# Patient Record
Sex: Female | Born: 1950
Health system: Southern US, Community
[De-identification: ages and names within clinical notes are randomized; demographics above are authoritative.]

## PROBLEM LIST (undated history)

## (undated) DIAGNOSIS — I7300 Raynaud's syndrome without gangrene: Secondary | ICD-10-CM

## (undated) DIAGNOSIS — E079 Disorder of thyroid, unspecified: Secondary | ICD-10-CM

## (undated) DIAGNOSIS — I10 Essential (primary) hypertension: Secondary | ICD-10-CM

## (undated) DIAGNOSIS — R748 Abnormal levels of other serum enzymes: Secondary | ICD-10-CM

## (undated) DIAGNOSIS — E785 Hyperlipidemia, unspecified: Secondary | ICD-10-CM

## (undated) DIAGNOSIS — H269 Unspecified cataract: Secondary | ICD-10-CM

## (undated) DIAGNOSIS — T7840XA Allergy, unspecified, initial encounter: Secondary | ICD-10-CM

## (undated) HISTORY — DX: Raynaud's syndrome without gangrene: I73.00

## (undated) HISTORY — DX: Abnormal levels of other serum enzymes: R74.8

## (undated) HISTORY — DX: Essential (primary) hypertension: I10

## (undated) HISTORY — DX: Disorder of thyroid, unspecified: E07.9

## (undated) HISTORY — DX: Hyperlipidemia, unspecified: E78.5

## (undated) HISTORY — DX: Unspecified cataract: H26.9

## (undated) HISTORY — DX: Allergy, unspecified, initial encounter: T78.40XA

## (undated) HISTORY — PX: EYE SURGERY: SHX253

## (undated) HISTORY — PX: OTHER SURGICAL HISTORY: SHX169

---

## 2001-07-25 ENCOUNTER — Other Ambulatory Visit: Admission: RE | Admit: 2001-07-25 | Discharge: 2001-07-25 | Payer: Self-pay | Admitting: Family Medicine

## 2001-08-03 ENCOUNTER — Encounter: Payer: Self-pay | Admitting: Family Medicine

## 2001-08-03 ENCOUNTER — Encounter: Admission: RE | Admit: 2001-08-03 | Discharge: 2001-08-03 | Payer: Self-pay | Admitting: Family Medicine

## 2004-08-26 ENCOUNTER — Encounter: Admission: RE | Admit: 2004-08-26 | Discharge: 2004-08-26 | Payer: Self-pay | Admitting: Family Medicine

## 2006-06-14 ENCOUNTER — Encounter: Admission: RE | Admit: 2006-06-14 | Discharge: 2006-06-14 | Payer: Self-pay | Admitting: Family Medicine

## 2008-01-09 DIAGNOSIS — I7300 Raynaud's syndrome without gangrene: Secondary | ICD-10-CM | POA: Insufficient documentation

## 2008-01-11 ENCOUNTER — Encounter: Admission: RE | Admit: 2008-01-11 | Discharge: 2008-01-11 | Payer: Self-pay | Admitting: Family Medicine

## 2008-04-22 ENCOUNTER — Ambulatory Visit: Payer: Self-pay | Admitting: General Surgery

## 2008-04-22 LAB — HM COLONOSCOPY: HM Colonoscopy: NORMAL

## 2009-10-19 ENCOUNTER — Encounter: Admission: RE | Admit: 2009-10-19 | Discharge: 2009-10-19 | Payer: Self-pay | Admitting: Family Medicine

## 2010-12-20 ENCOUNTER — Other Ambulatory Visit: Payer: Self-pay | Admitting: Family Medicine

## 2010-12-20 DIAGNOSIS — Z1231 Encounter for screening mammogram for malignant neoplasm of breast: Secondary | ICD-10-CM

## 2010-12-28 ENCOUNTER — Ambulatory Visit: Payer: Self-pay

## 2011-01-04 ENCOUNTER — Ambulatory Visit
Admission: RE | Admit: 2011-01-04 | Discharge: 2011-01-04 | Disposition: A | Discharge status: Home / Self Care | Payer: BC Managed Care – PPO | Source: Ambulatory Visit | Attending: Family Medicine | Admitting: Family Medicine

## 2011-01-04 DIAGNOSIS — Z1231 Encounter for screening mammogram for malignant neoplasm of breast: Secondary | ICD-10-CM

## 2011-12-12 ENCOUNTER — Other Ambulatory Visit: Payer: Self-pay | Admitting: Family Medicine

## 2011-12-12 DIAGNOSIS — Z1231 Encounter for screening mammogram for malignant neoplasm of breast: Secondary | ICD-10-CM

## 2012-01-12 ENCOUNTER — Ambulatory Visit
Admission: RE | Admit: 2012-01-12 | Discharge: 2012-01-12 | Disposition: A | Discharge status: Home / Self Care | Payer: BC Managed Care – PPO | Source: Ambulatory Visit | Attending: Family Medicine | Admitting: Family Medicine

## 2012-01-12 DIAGNOSIS — Z1231 Encounter for screening mammogram for malignant neoplasm of breast: Secondary | ICD-10-CM

## 2012-06-07 ENCOUNTER — Ambulatory Visit: Payer: Self-pay | Admitting: Rheumatology

## 2012-12-27 ENCOUNTER — Other Ambulatory Visit: Payer: Self-pay

## 2012-12-27 DIAGNOSIS — Z1231 Encounter for screening mammogram for malignant neoplasm of breast: Secondary | ICD-10-CM

## 2012-12-27 LAB — HM PAP SMEAR: HM PAP: NEGATIVE

## 2013-01-18 ENCOUNTER — Ambulatory Visit: Payer: BC Managed Care – PPO

## 2013-01-24 ENCOUNTER — Ambulatory Visit
Admission: RE | Admit: 2013-01-24 | Discharge: 2013-01-24 | Disposition: A | Discharge status: Home / Self Care | Payer: BC Managed Care – PPO | Source: Ambulatory Visit

## 2013-01-24 DIAGNOSIS — Z1231 Encounter for screening mammogram for malignant neoplasm of breast: Secondary | ICD-10-CM

## 2014-02-11 ENCOUNTER — Other Ambulatory Visit: Payer: Self-pay

## 2014-02-11 DIAGNOSIS — Z1231 Encounter for screening mammogram for malignant neoplasm of breast: Secondary | ICD-10-CM

## 2014-03-06 ENCOUNTER — Ambulatory Visit
Admission: RE | Admit: 2014-03-06 | Discharge: 2014-03-06 | Disposition: A | Discharge status: Home / Self Care | Payer: BC Managed Care – PPO | Source: Ambulatory Visit

## 2014-03-06 DIAGNOSIS — Z1231 Encounter for screening mammogram for malignant neoplasm of breast: Secondary | ICD-10-CM

## 2014-03-06 LAB — HM MAMMOGRAPHY

## 2014-04-16 ENCOUNTER — Ambulatory Visit: Payer: Self-pay | Admitting: Family Medicine

## 2014-08-12 ENCOUNTER — Ambulatory Visit: Payer: Self-pay | Admitting: Family Medicine

## 2015-02-19 LAB — BASIC METABOLIC PANEL
BUN: 14 mg/dL (ref 4–21)
CREATININE: 0.8 mg/dL (ref 0.5–1.1)
Glucose: 102 mg/dL
Potassium: 4.6 mmol/L (ref 3.4–5.3)
Sodium: 142 mmol/L (ref 137–147)

## 2015-02-19 LAB — HEPATIC FUNCTION PANEL
ALT: 46 U/L — AB (ref 7–35)
AST: 31 U/L (ref 13–35)

## 2015-02-19 LAB — LIPID PANEL
CHOLESTEROL: 269 mg/dL — AB (ref 0–200)
HDL: 110 mg/dL — AB (ref 35–70)
LDL Cholesterol: 141 mg/dL
TRIGLYCERIDES: 88 mg/dL (ref 40–160)

## 2015-02-20 ENCOUNTER — Telehealth: Payer: Self-pay

## 2015-02-20 DIAGNOSIS — R945 Abnormal results of liver function studies: Principal | ICD-10-CM

## 2015-02-20 DIAGNOSIS — R7989 Other specified abnormal findings of blood chemistry: Secondary | ICD-10-CM

## 2015-02-20 NOTE — Telephone Encounter (Signed)
Pt message from [employer]: Lipids the same.  Two liver enzymes are elevated.  Needs GI referral if has not been done previously. Thanks- Dr. Judie PetitM. Pt has no preference in who to see. Allene DillonEmily Drozdowski, CMA

## 2015-03-04 ENCOUNTER — Ambulatory Visit: Payer: Self-pay | Admitting: Gastroenterology

## 2015-03-16 ENCOUNTER — Ambulatory Visit: Payer: Self-pay | Admitting: Gastroenterology

## 2015-03-17 ENCOUNTER — Ambulatory Visit (INDEPENDENT_AMBULATORY_CARE_PROVIDER_SITE_OTHER): Payer: BC Managed Care – PPO | Admitting: Gastroenterology

## 2015-03-17 ENCOUNTER — Encounter: Payer: Self-pay | Admitting: Gastroenterology

## 2015-03-17 ENCOUNTER — Other Ambulatory Visit: Payer: Self-pay

## 2015-03-17 VITALS — BP 165/81 | HR 109 | Temp 98.6°F | Resp 20 | Ht 67.0 in | Wt 169.0 lb

## 2015-03-17 DIAGNOSIS — R748 Abnormal levels of other serum enzymes: Secondary | ICD-10-CM | POA: Diagnosis not present

## 2015-03-17 DIAGNOSIS — I7300 Raynaud's syndrome without gangrene: Secondary | ICD-10-CM | POA: Insufficient documentation

## 2015-03-17 HISTORY — DX: Raynaud's syndrome without gangrene: I73.00

## 2015-03-17 HISTORY — DX: Abnormal levels of other serum enzymes: R74.8

## 2015-03-17 NOTE — Progress Notes (Signed)
Gastroenterology Consultation  Referring Provider:     No ref. provider found Primary Care Physician:  Lorie Phenix, MD Primary Gastroenterologist:  Dr. Servando Snare     Reason for Consultation:     Increased liver enzymes        HPI:   Angela Valenzuela is a 65 y.o. y/o female referred for consultation & management of  Increased liver enzymes by Dr. Lorie Phenix, MD.   The patient reports that she has a history of scleroderma with joint pain for which is taking meloxicam. The patient reports that her abnormal liver enzymes were noticed before she started taking meloxicam. The patient denies any alcohol abuse or other hepatotoxic drugs. The patient also denies any high risk exposure to any viruses that can cause hepatitis. The patient denies starting on any other medications recently. There is no report of any abdominal pain associated with her abnormal liver enzymes. The patient also reports that she had an ultrasound last year that did not report any fatty liver.  Past Medical History  Diagnosis Date  . Paroxysmal digital cyanosis 03/17/2015    Overview:   a.  Positive anticentromere antibody b.   Sicca-xerostomia c.  RVSP 41 in 2013   . Hypertension   . Hyperlipidemia     History reviewed. No pertinent past surgical history.  Prior to Admission medications   Medication Sig Start Date End Date Taking? Authorizing Provider  amLODipine (NORVASC) 5 MG tablet  03/15/15  Yes Historical Provider, MD  Calcium-Vitamin D 600-200 MG-UNIT per tablet Take by mouth.   Yes Historical Provider, MD  meloxicam (MOBIC) 7.5 MG tablet Take 7.5 mg by mouth daily. 02/24/15  Yes Historical Provider, MD  RA KRILL OIL 500 MG CAPS Take by mouth.   Yes Historical Provider, MD  sodium fluoride (DENTA 5000 PLUS) 1.1 % CREA dental cream  11/14/14  Yes Historical Provider, MD  diltiazem (CARDIZEM LA) 120 MG 24 hr tablet Take by mouth. 02/05/14   Historical Provider, MD  omeprazole (PRILOSEC) 20 MG capsule Take by mouth.     Historical Provider, MD    Family History  Problem Relation Age of Onset  . Hypertension Father   . Stroke Father   . Alzheimer's disease Mother      History  Substance Use Topics  . Smoking status: Never Smoker   . Smokeless tobacco: Never Used  . Alcohol Use: 0.0 oz/week    0 Standard drinks or equivalent per week     Comment: occassional    Allergies as of 03/17/2015  . (No Known Allergies)    Review of Systems:    All systems reviewed and negative except where noted in HPI.   Physical Exam:  BP 165/81 mmHg  Pulse 109  Temp(Src) 98.6 F (37 C) (Oral)  Resp 20  Ht  (1.702 m)  Wt 169 lb (76.658 kg)  BMI 26.46 kg/m2 No LMP recorded. Patient is postmenopausal. Psych:  Alert and cooperative. Normal mood and affect. General:   Alert,  Well-developed, well-nourished, pleasant and cooperative in NAD Head:  Normocephalic and atraumatic. Eyes:  Sclera clear, no icterus.   Conjunctiva pink. Ears:  Normal auditory acuity. Nose:  No deformity, discharge, or lesions. Mouth:  No deformity or lesions,oropharynx pink & moist. Neck:  Supple; no masses or thyromegaly. Lungs:  Respirations even and unlabored.  Clear throughout to auscultation.   No wheezes, crackles, or rhonchi. No acute distress. Heart:  Regular rate and rhythm; no murmurs, clicks, rubs,  or gallops. Abdomen:  Normal bowel sounds.  No bruits.  Soft, non-tender and non-distended without masses. Hepatomegaly was noted with a normal spleen and no hernias noted.  No guarding or rebound tenderness.  Negative Carnett sign.   Rectal:  Deferred.  Msk:  Symmetrical without gross deformities.  Good, equal movement & strength bilaterally. Pulses:  Normal pulses noted. Extremities:  No clubbing or edema.  No cyanosis. Neurologic:  Alert and oriented x3;  grossly normal neurologically. Skin:  Intact without significant lesions or rashes.  No jaundice. Lymph Nodes:  No significant cervical adenopathy. Psych:  Alert and  cooperative. Normal mood and affect.  Imaging Studies: No results found.  Assessment and Plan:   Angela Valenzuela is a 64 y.o. y/o female  who comes in today with a report of abnormal liver enzymes. The patient had an ultrasound last year that did not show any sign of a cause for her abnormal liver enzymes. Due to her history of autoimmune disease the patient may be suffering from autoimmune hepatitis or abnormal liver enzymes associated with her scleroderma. The patient will have blood work sent off today to look for other possible causes of her abnormal liver enzymes. She is also been told that if they do not show a cause for her abnormal liver enzymes she may need a liver biopsy to evaluate her for the cause of the abnormal liver enzymes. The patient has been explained the plan and agrees it. with it.

## 2015-03-18 LAB — HEPATIC FUNCTION PANEL
ALT: 44 IU/L — AB (ref 0–32)
AST: 31 IU/L (ref 0–40)
Albumin: 4.5 g/dL (ref 3.6–4.8)
Alkaline Phosphatase: 183 IU/L — ABNORMAL HIGH (ref 39–117)
Bilirubin Total: 0.2 mg/dL (ref 0.0–1.2)
Bilirubin, Direct: 0.09 mg/dL (ref 0.00–0.40)
TOTAL PROTEIN: 6.7 g/dL (ref 6.0–8.5)

## 2015-03-18 LAB — CERULOPLASMIN: CERULOPLASMIN: 37.9 mg/dL (ref 19.0–39.0)

## 2015-03-18 LAB — HEPATITIS C ANTIBODY

## 2015-03-18 LAB — EXTRACTABLE NUCLEAR ANTIGEN ANTIBODY
ENA SSB (LA) Ab: <0.2 AI (ref 0.0–0.9)
Scleroderma SCL-70: <0.2 AI (ref 0.0–0.9)
dsDNA Ab: <1 IU/mL (ref 0–9)

## 2015-03-18 LAB — MITOCHONDRIAL ANTIBODIES: MITOCHONDRIAL AB: 25.7 U — AB (ref 0.0–20.0)

## 2015-03-18 LAB — HEPATITIS B SURFACE ANTIBODY,QUALITATIVE: Hep B Surface Ab, Qual: NONREACTIVE

## 2015-03-18 LAB — HEPATITIS A ANTIBODY, TOTAL: Hep A Total Ab: NEGATIVE

## 2015-03-18 LAB — ALPHA-1-ANTITRYPSIN: A-1 Antitrypsin: 142 mg/dL (ref 90–200)

## 2015-03-18 LAB — ANTI-SMOOTH MUSCLE ANTIBODY, IGG: Smooth Muscle Ab: 7 Units (ref 0–19)

## 2015-03-18 LAB — IGG, IGA, IGM
IGA/IMMUNOGLOBULIN A, SERUM: 74 mg/dL — AB (ref 87–352)
IGG (IMMUNOGLOBIN G), SERUM: 364 mg/dL — AB (ref 700–1600)
IgM (Immunoglobulin M), Srm: 159 mg/dL (ref 26–217)

## 2015-03-18 LAB — AFP TUMOR MARKER: AFP TUMOR MARKER: 2.4 ng/mL (ref 0.0–8.3)

## 2015-03-18 LAB — HEPATITIS B SURFACE ANTIGEN: Hepatitis B Surface Ag: NEGATIVE

## 2015-03-25 ENCOUNTER — Telehealth: Payer: Self-pay

## 2015-03-25 NOTE — Telephone Encounter (Signed)
-----   Message from Midge Miniumarren Wohl, MD sent at 03/24/2015 12:50 PM EDT ----- Dontarious Schaum, please have the patient come in to follow up with me about her lab results.

## 2015-03-25 NOTE — Telephone Encounter (Signed)
LVM for pt to schedule follow up appt to discuss lab results.

## 2015-03-26 NOTE — Telephone Encounter (Signed)
Pt notified of request for an appt. Pt scheduled with Dr. Servando SnareWohl on Monday, July 11th.

## 2015-03-26 NOTE — Telephone Encounter (Signed)
-----   Message from Darren Wohl, MD sent at 03/24/2015 12:50 PM EDT ----- Wildon Cuevas, please have the patient come in to follow up with me about her lab results. 

## 2015-03-30 ENCOUNTER — Encounter: Payer: Self-pay | Admitting: Gastroenterology

## 2015-03-30 ENCOUNTER — Ambulatory Visit (INDEPENDENT_AMBULATORY_CARE_PROVIDER_SITE_OTHER): Payer: BC Managed Care – PPO | Admitting: Gastroenterology

## 2015-03-30 VITALS — BP 153/71 | HR 93 | Temp 98.3°F | Ht 67.0 in | Wt 170.0 lb

## 2015-03-30 DIAGNOSIS — K743 Primary biliary cirrhosis: Secondary | ICD-10-CM

## 2015-03-30 MED ORDER — URSODIOL 300 MG PO CAPS: 300.0000 mg | ORAL_CAPSULE | Freq: Four times a day (QID) | ORAL | Status: DC

## 2015-03-30 NOTE — Progress Notes (Signed)
   Primary Care Physician: Lorie PhenixNancy Maloney, MD  Primary Gastroenterologist:  Dr. Midge Miniumarren Jatavia Keltner  Chief Complaint  Patient presents with  . F/U lab results    HPI: Angela Valenzuela is a 64 y.o. female here for review of blood work. The patient had been found to have abnormal liver enzymes and had blood work sent off for possible cause of the abnormal liver enzymes. The patient's labs showed her to have an anti-mitochondrial antibody positive. The patient also has a history of scleroderma. The patient's alkaline phosphatase has been elevated.  Current Outpatient Prescriptions  Medication Sig Dispense Refill  . amLODipine (NORVASC) 5 MG tablet     . Calcium-Vitamin D 600-200 MG-UNIT per tablet Take by mouth.    . meloxicam (MOBIC) 7.5 MG tablet Take 7.5 mg by mouth daily.  3  . RA KRILL OIL 500 MG CAPS Take by mouth.    . sodium fluoride (DENTA 5000 PLUS) 1.1 % CREA dental cream     . omeprazole (PRILOSEC) 20 MG capsule Take by mouth.    . ursodiol (ACTIGALL) 300 MG capsule Take 1 capsule (300 mg total) by mouth 4 (four) times daily. 120 capsule 4   No current facility-administered medications for this visit.    Allergies as of 03/30/2015  . (No Known Allergies)    ROS:  General: Negative for anorexia, weight loss, fever, chills, fatigue, weakness. ENT: Negative for hoarseness, difficulty swallowing , nasal congestion. CV: Negative for chest pain, angina, palpitations, dyspnea on exertion, peripheral edema.  Respiratory: Negative for dyspnea at rest, dyspnea on exertion, cough, sputum, wheezing.  GI: See history of present illness. GU:  Negative for dysuria, hematuria, urinary incontinence, urinary frequency, nocturnal urination.  Endo: Negative for unusual weight change.    Physical Examination:   BP 153/71 mmHg  Pulse 93  Temp(Src) 98.3 F (36.8 C)  Ht 5\' 7"  (1.702 m)  Wt 170 lb (77.111 kg)  BMI 26.62 kg/m2  General: Well-nourished, well-developed in no acute distress.    Eyes: No icterus. Conjunctivae pink. Mouth: Oropharyngeal mucosa moist and pink , no lesions erythema or exudate. Lungs: Clear to auscultation bilaterally. Non-labored. Heart: Regular rate and rhythm, no murmurs rubs or gallops.  Abdomen: Bowel sounds are normal, nontender, nondistended, no hepatosplenomegaly or masses, no abdominal bruits or hernia , no rebound or guarding.   Extremities: No lower extremity edema. No clubbing or deformities. Neuro: Alert and oriented x 3.  Grossly intact. Skin: Warm and dry, no jaundice.   Psych: Alert and cooperative, normal mood and affect.  Labs:    Imaging Studies: No results found.  Assessment and Plan:   Angela Valenzuela is a 64 y.o. y/o female who comes in with a positive anti-mitochondrial antibody and abnormal liver enzymes. The patient is not excited about undergoing a liver biopsy. The patient has been offered treatment for possible PBC with monitoring of her alkaline phosphatase. The patient will be started on 1200 mg in divided doses of ursodeoxycholic acid. The patient will follow-up in one month's time for recheck of her liver enzymes.

## 2015-04-30 ENCOUNTER — Ambulatory Visit (INDEPENDENT_AMBULATORY_CARE_PROVIDER_SITE_OTHER): Payer: BC Managed Care – PPO | Admitting: Gastroenterology

## 2015-04-30 VITALS — BP 149/67 | HR 100 | Temp 98.3°F | Ht 67.0 in | Wt 168.0 lb

## 2015-04-30 DIAGNOSIS — K743 Primary biliary cirrhosis: Secondary | ICD-10-CM | POA: Diagnosis not present

## 2015-04-30 DIAGNOSIS — I10 Essential (primary) hypertension: Secondary | ICD-10-CM | POA: Insufficient documentation

## 2015-04-30 DIAGNOSIS — I7300 Raynaud's syndrome without gangrene: Secondary | ICD-10-CM

## 2015-04-30 DIAGNOSIS — E785 Hyperlipidemia, unspecified: Secondary | ICD-10-CM | POA: Insufficient documentation

## 2015-04-30 HISTORY — DX: Raynaud's syndrome without gangrene: I73.00

## 2015-04-30 NOTE — Progress Notes (Signed)
   Primary Care Physician: Lorie Phenix, MD  Primary Gastroenterologist:  Dr. Midge Minium  Chief Complaint  Patient presents with  . F/U PBC    HPI: Angela Valenzuela is a 64 y.o. female here for follow-up after being treated for possible PBC. The patient had abnormal alkaline phosphatase and was found to have a high AMA. The patient was started on ursodeoxycholic acid and now comes in for follow-up. This has been treated for last month. The patient states she has had no side effects of medication and has been feeling well.  Current Outpatient Prescriptions  Medication Sig Dispense Refill  . amLODipine (NORVASC) 5 MG tablet     . Calcium-Vitamin D 600-200 MG-UNIT per tablet Take by mouth.    . meloxicam (MOBIC) 7.5 MG tablet Take 7.5 mg by mouth daily.  3  . RA KRILL OIL 500 MG CAPS Take by mouth.    . sodium fluoride (DENTA 5000 PLUS) 1.1 % CREA dental cream     . ursodiol (ACTIGALL) 300 MG capsule Take 1 capsule (300 mg total) by mouth 4 (four) times daily. 120 capsule 4  . omeprazole (PRILOSEC) 20 MG capsule Take by mouth.     No current facility-administered medications for this visit.    Allergies as of 04/30/2015  . (No Known Allergies)    ROS:  General: Negative for anorexia, weight loss, fever, chills, fatigue, weakness. ENT: Negative for hoarseness, difficulty swallowing , nasal congestion. CV: Negative for chest pain, angina, palpitations, dyspnea on exertion, peripheral edema.  Respiratory: Negative for dyspnea at rest, dyspnea on exertion, cough, sputum, wheezing.  GI: See history of present illness. GU:  Negative for dysuria, hematuria, urinary incontinence, urinary frequency, nocturnal urination.  Endo: Negative for unusual weight change.    Physical Examination:   BP 149/67 mmHg  Pulse 100  Temp(Src) 98.3 F (36.8 C)  Ht  (1.702 m)  Wt 168 lb (76.204 kg)  BMI 26.31 kg/m2  General: Well-nourished, well-developed in no acute distress.  Eyes: No  icterus. Conjunctivae pink. Mouth: Oropharyngeal mucosa moist and pink , no lesions erythema or exudate. Lungs: Clear to auscultation bilaterally. Non-labored. Heart: Regular rate and rhythm, no murmurs rubs or gallops.  Abdomen: Bowel sounds are normal, nontender, nondistended, no hepatosplenomegaly or masses, no abdominal bruits or hernia , no rebound or guarding.   Extremities: No lower extremity edema. No clubbing or deformities. Neuro: Alert and oriented x 3.  Grossly intact. Skin: Warm and dry, no jaundice.   Psych: Alert and cooperative, normal mood and affect.  Labs:    Imaging Studies: No results found.  Assessment and Plan:   Angela Valenzuela is a 64 y.o. y/o female with a positive AMA and recent treatment with ursodeoxycholic acid for the last month. The patient will continue the ursodeoxycholic acid and she will have her labs sent today for liver enzymes. If the liver enzymes are better then she will continue the ursodeoxycholic acid if they are not she may consider a liver biopsy.   Note: This dictation was prepared with Dragon dictation along with smaller phrase technology. Any transcriptional errors that result from this process are unintentional.

## 2015-05-01 LAB — HEPATITIS B SURFACE ANTIBODY,QUALITATIVE: Hep B Surface Ab, Qual: NONREACTIVE

## 2015-05-06 LAB — SPECIMEN STATUS REPORT

## 2015-05-06 LAB — HEPATIC FUNCTION PANEL

## 2015-05-13 ENCOUNTER — Other Ambulatory Visit: Payer: Self-pay

## 2015-05-13 DIAGNOSIS — Z1231 Encounter for screening mammogram for malignant neoplasm of breast: Secondary | ICD-10-CM

## 2015-05-13 LAB — HEPATIC FUNCTION PANEL
ALT: 22 IU/L (ref 0–32)
AST: 15 IU/L (ref 0–40)
Albumin: 4.4 g/dL (ref 3.6–4.8)
Alkaline Phosphatase: 115 IU/L (ref 39–117)
BILIRUBIN TOTAL: 0.2 mg/dL (ref 0.0–1.2)
BILIRUBIN, DIRECT: 0.07 mg/dL (ref 0.00–0.40)
Total Protein: 6.2 g/dL (ref 6.0–8.5)

## 2015-05-18 ENCOUNTER — Telehealth: Payer: Self-pay

## 2015-05-18 NOTE — Telephone Encounter (Signed)
-----   Message from Midge Minium, MD sent at 05/18/2015  6:43 AM EDT ----- Let the patient know that her liver tests are back to normal and that she should continue the medication.

## 2015-05-18 NOTE — Telephone Encounter (Signed)
Pt notified of results. Will repeat labs in 3 months.

## 2015-06-22 ENCOUNTER — Ambulatory Visit: Payer: BC Managed Care – PPO

## 2015-06-23 ENCOUNTER — Ambulatory Visit: Payer: BC Managed Care – PPO

## 2015-06-24 DIAGNOSIS — R945 Abnormal results of liver function studies: Secondary | ICD-10-CM | POA: Insufficient documentation

## 2015-06-24 DIAGNOSIS — E039 Hypothyroidism, unspecified: Secondary | ICD-10-CM | POA: Insufficient documentation

## 2015-06-24 DIAGNOSIS — N281 Cyst of kidney, acquired: Secondary | ICD-10-CM | POA: Insufficient documentation

## 2015-06-24 DIAGNOSIS — R7989 Other specified abnormal findings of blood chemistry: Secondary | ICD-10-CM | POA: Insufficient documentation

## 2015-06-24 DIAGNOSIS — IMO0002 Reserved for concepts with insufficient information to code with codable children: Secondary | ICD-10-CM | POA: Insufficient documentation

## 2015-06-24 DIAGNOSIS — R682 Dry mouth, unspecified: Secondary | ICD-10-CM | POA: Insufficient documentation

## 2015-06-24 DIAGNOSIS — M349 Systemic sclerosis, unspecified: Secondary | ICD-10-CM | POA: Insufficient documentation

## 2015-06-24 DIAGNOSIS — L309 Dermatitis, unspecified: Secondary | ICD-10-CM | POA: Insufficient documentation

## 2015-06-24 DIAGNOSIS — E038 Other specified hypothyroidism: Secondary | ICD-10-CM | POA: Insufficient documentation

## 2015-06-24 DIAGNOSIS — R319 Hematuria, unspecified: Secondary | ICD-10-CM | POA: Insufficient documentation

## 2015-06-25 ENCOUNTER — Ambulatory Visit (INDEPENDENT_AMBULATORY_CARE_PROVIDER_SITE_OTHER): Payer: BC Managed Care – PPO | Admitting: Family Medicine

## 2015-06-25 ENCOUNTER — Encounter: Payer: Self-pay | Admitting: Family Medicine

## 2015-06-25 VITALS — BP 130/70 | HR 72 | Temp 98.3°F | Resp 16 | Ht 67.0 in | Wt 171.4 lb

## 2015-06-25 DIAGNOSIS — Z0000 Encounter for general adult medical examination without abnormal findings: Secondary | ICD-10-CM

## 2015-06-25 DIAGNOSIS — Z23 Encounter for immunization: Secondary | ICD-10-CM

## 2015-06-25 DIAGNOSIS — E785 Hyperlipidemia, unspecified: Secondary | ICD-10-CM

## 2015-06-25 DIAGNOSIS — I10 Essential (primary) hypertension: Secondary | ICD-10-CM | POA: Diagnosis not present

## 2015-06-25 LAB — POCT URINALYSIS DIPSTICK
Bilirubin, UA: NEGATIVE
GLUCOSE UA: NEGATIVE
Ketones, UA: NEGATIVE
Leukocytes, UA: NEGATIVE
Nitrite, UA: NEGATIVE
Protein, UA: NEGATIVE
RBC UA: NEGATIVE
SPEC GRAV UA: 1.01
UROBILINOGEN UA: 0.2
pH, UA: 7

## 2015-06-25 NOTE — Progress Notes (Addendum)
Patient: Angela Valenzuela, Female    DOB: [birth year], 64 y.o.   MRN: [id number] Visit Date: 06/25/2015  Today's Provider: Lorie Phenix, MD   Chief Complaint  Patient presents with  . Annual Exam   Subjective:    Annual physical exam Angela Valenzuela is a 64 y.o. female who presents today for health maintenance and complete physical. She feels well. She reports not exercising, has not been exercising lately since April 27 of this year. She reports she is sleeping well.  Last PCP:03/20/2014 Pap Smear: 12/27/2012 WNL; HPV Negative Mammogram: 03/06/2014 BI-RADS 1. Has Mammogram scheduled. Colonoscopy: 04/22/2008 Dr. Fonnie Mu. WNL. Repeat in 10 years. EKG: 03/20/2014 Tdap: 01/11/2008    Review of Systems  Constitutional: Negative.   HENT: Negative.   Eyes: Negative.   Respiratory: Negative.   Cardiovascular: Negative.   Gastrointestinal: Negative.   Endocrine: Positive for cold intolerance.  Genitourinary: Negative.   Musculoskeletal: Positive for neck stiffness (only in the morning).  Skin: Negative.   Allergic/Immunologic: Negative.   Neurological: Negative.   Hematological: Negative.   Psychiatric/Behavioral: Negative.     Social History She  reports that she has never smoked. She has never used smokeless tobacco. She reports that she drinks alcohol. She reports that she does not use illicit drugs. Social History   Social History  . Marital Status: Married    Spouse Name: N/A  . Number of Children: N/A  . Years of Education: N/A   Social History Main Topics  . Smoking status: Never Smoker   . Smokeless tobacco: Never Used  . Alcohol Use: 0.0 oz/week    0 Standard drinks or equivalent per week     Comment: occassional  . Drug Use: No  . Sexual Activity: Not Asked   Other Topics Concern  . None   Social History Narrative    Patient Active Problem List   Diagnosis Date Noted  . Kidney cysts 06/24/2015  . Dermatitis, eczematoid 06/24/2015  . Dry  mouth 06/24/2015  . Abnormal LFTs 06/24/2015  . Blood in the urine 06/24/2015  . 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 Subclinical hypothyroidism 06/24/2015  . Epidermoid carcinoma (HCC) 06/24/2015  . Raynaud's disease 04/30/2015  . Hypertension 04/30/2015  . Hyperlipidemia 04/30/2015  . Paroxysmal digital cyanosis 03/17/2015  . Increased liver enzymes 03/17/2015  . Raynaud's syndrome without gangrene 01/09/2008  . Hypercholesteremia 09/08/2006  . Allergic rhinitis 01/21/1999  . BP (high blood pressure) 01/21/1999    History reviewed. No pertinent past surgical history.  Family History  Family Status  Relation Status Death Age  . Father Deceased 28    HTN  . Mother Deceased 47    Alzheimer's disease  . Sister Alive   . Brother Alive    Her family history includes Alzheimer's disease in her mother; Hypertension in her father; Stroke in her father.    No Known Allergies  Previous Medications   AMLODIPINE (NORVASC) 5 MG TABLET       CALCIUM-VITAMIN D 600-200 MG-UNIT PER TABLET    Take by mouth.   IBUPROFEN (ADVIL) 200 MG TABLET    ADVIL, 200MG  (Oral Tablet)  1 Every Day, As Needed for 0 days  Quantity: 0.00;  Refills: 0   Ordered :11-Nov-2011  Amie Critchley ;  Started 01-Jan-2008 Active Comments: Medication taken as needed.    LORATADINE (CLARITIN) 10 MG TABLET    Take 1 tablet by mouth daily.   MELOXICAM (MOBIC) 7.5 MG TABLET  Take 7.5 mg by mouth daily.   OMEPRAZOLE (PRILOSEC) 20 MG CAPSULE    Take by mouth.   RA KRILL OIL 500 MG CAPS    Take by mouth.   SODIUM FLUORIDE (DENTA 5000 PLUS) 1.1 % CREA DENTAL CREAM       URSODIOL (ACTIGALL) 300 MG CAPSULE    Take 1 capsule (300 mg total) by mouth 4 (four) times daily.    Patient Care Team: Lorie Phenix, MD as PCP - General (Family Medicine)     Objective:   Vitals: BP 130/70 mmHg  Pulse 72  Temp(Src) 98.3 F (36.8 C) (Oral)  Resp 16  Ht  (1.702 m)  Wt 171 lb 6.4 oz (77.747 kg)  BMI 26.84  kg/m2   Physical Exam  Constitutional: She is oriented to person, place, and time. She appears well-developed and well-nourished.  HENT:  Head: Normocephalic and atraumatic.  Right Ear: External ear normal.  Left Ear: External ear normal.  Nose: Nose normal.  Mouth/Throat: Oropharynx is clear and moist.  Eyes: Conjunctivae and EOM are normal. Pupils are equal, round, and reactive to light.  Neck: Normal range of motion. Neck supple.  Cardiovascular: Normal rate, regular rhythm, normal heart sounds and intact distal pulses.   Pulmonary/Chest: Effort normal and breath sounds normal.  Abdominal: Soft. Bowel sounds are normal.  Musculoskeletal: Normal range of motion.  Neurological: She is alert and oriented to person, place, and time. She has normal reflexes.  Skin: Skin is warm and dry.  Psychiatric: She has a normal mood and affect. Her behavior is normal. Judgment and thought content normal.     Depression Screen PHQ 2/9 Scores 06/25/2015  PHQ - 2 Score 0      Assessment & Plan:     Routine Health Maintenance and Physical Exam  Exercise Activities and Dietary recommendations Goals    None      Immunization History  Administered Date(s) Administered  . Influenza,inj,Quad PF,36+ Mos 06/25/2015  . Tdap 01/11/2008    Health Maintenance  Topic Date Due  . HIV Screening  12/14/1965  . ZOSTAVAX  12/15/2010  . INFLUENZA VACCINE  04/20/2015  . PAP SMEAR  12/28/2015  . MAMMOGRAM  03/06/2016  . TETANUS/TDAP  01/10/2018  . COLONOSCOPY  04/22/2018  . Hepatitis C Screening  Completed       1. Annual physical exam Stable. Patient advised to continue eating healthy and exercise daily.  Gave exercises to strengthen quads after meniscus tear. Ok to increase walking.   - POCT urinalysis dipstick Results for orders placed or performed in visit on 06/25/15  POCT urinalysis dipstick  Result Value Ref Range   Color, UA Yellow    Clarity, UA Clear    Glucose, UA negative      Bilirubin, UA negative    Ketones, UA negative    Spec Grav, UA 1.010    Blood, UA negative    pH, UA 7.0    Protein, UA negative    Urobilinogen, UA 0.2    Nitrite, UA negative    Leukocytes, UA Negative Negative    2. Need for influenza vaccination - Flu Vaccine QUAD 36+ mos IM  3. Essential hypertension Patient advised to have labs check every six months - Comprehensive metabolic panel - CBC with Differential/Platelet  4. Hyperlipidemia As above - Lipid panel - TSH  Patient was seen and examined by Leo Grosser, MD, and note scribed by Hetty Ely, CMA. I have reviewed the document for  accuracy and completeness and I agree with above. Leo Grosser, MD   Lorie Phenix, MD     --------------------------------------------------------------------

## 2015-07-04 LAB — CBC WITH DIFFERENTIAL/PLATELET
BASOS ABS: 0 10*3/uL (ref 0.0–0.2)
Basos: 0 %
EOS (ABSOLUTE): 0.1 10*3/uL (ref 0.0–0.4)
Eos: 2 %
HEMATOCRIT: 41.6 % (ref 34.0–46.6)
HEMOGLOBIN: 13.8 g/dL (ref 11.1–15.9)
IMMATURE GRANULOCYTES: 0 %
Immature Grans (Abs): 0 10*3/uL (ref 0.0–0.1)
LYMPHS ABS: 1.4 10*3/uL (ref 0.7–3.1)
LYMPHS: 30 %
MCH: 28.2 pg (ref 26.6–33.0)
MCHC: 33.2 g/dL (ref 31.5–35.7)
MCV: 85 fL (ref 79–97)
MONOCYTES: 7 %
Monocytes Absolute: 0.3 10*3/uL (ref 0.1–0.9)
NEUTROS PCT: 61 %
Neutrophils Absolute: 2.8 10*3/uL (ref 1.4–7.0)
Platelets: 218 10*3/uL (ref 150–379)
RBC: 4.89 x10E6/uL (ref 3.77–5.28)
RDW: 13.9 % (ref 12.3–15.4)
WBC: 4.6 10*3/uL (ref 3.4–10.8)

## 2015-07-04 LAB — COMPREHENSIVE METABOLIC PANEL
A/G RATIO: 2.1 (ref 1.1–2.5)
ALBUMIN: 4.1 g/dL (ref 3.6–4.8)
ALK PHOS: 114 IU/L (ref 39–117)
ALT: 18 IU/L (ref 0–32)
AST: 18 IU/L (ref 0–40)
BILIRUBIN TOTAL: 0.3 mg/dL (ref 0.0–1.2)
BUN / CREAT RATIO: 23 (ref 11–26)
BUN: 15 mg/dL (ref 8–27)
CALCIUM: 9.3 mg/dL (ref 8.7–10.3)
CHLORIDE: 103 mmol/L (ref 97–108)
CO2: 26 mmol/L (ref 18–29)
Creatinine, Ser: 0.64 mg/dL (ref 0.57–1.00)
GFR calc non Af Amer: 95 mL/min/{1.73_m2} (ref >59)
GFR, EST AFRICAN AMERICAN: 109 mL/min/{1.73_m2} (ref >59)
Globulin, Total: 2 g/dL (ref 1.5–4.5)
Glucose: 98 mg/dL (ref 65–99)
POTASSIUM: 5 mmol/L (ref 3.5–5.2)
Sodium: 145 mmol/L — ABNORMAL HIGH (ref 134–144)
Total Protein: 6.1 g/dL (ref 6.0–8.5)

## 2015-07-04 LAB — LIPID PANEL
CHOL/HDL RATIO: 2.6 ratio (ref 0.0–4.4)
Cholesterol, Total: 247 mg/dL — ABNORMAL HIGH (ref 100–199)
HDL: 94 mg/dL (ref >39)
LDL Calculated: 139 mg/dL — ABNORMAL HIGH (ref 0–99)
Triglycerides: 72 mg/dL (ref 0–149)
VLDL Cholesterol Cal: 14 mg/dL (ref 5–40)

## 2015-07-04 LAB — TSH: TSH: 4.03 u[IU]/mL (ref 0.450–4.500)

## 2015-07-06 ENCOUNTER — Telehealth: Payer: Self-pay

## 2015-07-06 NOTE — Telephone Encounter (Signed)
Pt advised as directed below.   Thanks,   -Laura  

## 2015-07-06 NOTE — Telephone Encounter (Signed)
-----   Message from Lorie PhenixNancy Maloney, MD sent at 07/05/2015 10:41 AM EDT ----- Labs stable. Does have high cholesterol but good cholesterol is also high and 10 year risk of heart disease is calculated at 6 percent, which is below threshold for medical treatment. Eat healthy and exercise and recheck annually. Thanks.

## 2015-07-08 ENCOUNTER — Ambulatory Visit: Payer: BC Managed Care – PPO

## 2015-08-04 ENCOUNTER — Ambulatory Visit
Admission: RE | Admit: 2015-08-04 | Discharge: 2015-08-04 | Disposition: A | Discharge status: Home / Self Care | Payer: BC Managed Care – PPO | Source: Ambulatory Visit

## 2015-08-04 DIAGNOSIS — Z1231 Encounter for screening mammogram for malignant neoplasm of breast: Secondary | ICD-10-CM

## 2015-08-11 ENCOUNTER — Telehealth: Payer: Self-pay

## 2015-08-11 NOTE — Telephone Encounter (Signed)
-----   Message from Rayann HemanGinger Theadore Blunck, CMA sent at 05/18/2015  3:24 PM EDT ----- Regarding: repeat labs Call pt to repeat Hepatic function.

## 2015-08-11 NOTE — Telephone Encounter (Signed)
Pt had repeat CMP with PCP on 06/29/15. Hepatic function normal. No need for repeat at this time.

## 2015-08-17 ENCOUNTER — Telehealth: Payer: Self-pay | Admitting: Gastroenterology

## 2015-08-17 NOTE — Telephone Encounter (Signed)
Please call patient about her repeat labs

## 2015-08-17 NOTE — Telephone Encounter (Signed)
Pt is currently taking Ursodiol 300mg  qid. She is having to repeat labs with us every 3 months. Pt just had these done by Dr. Elease HashimotoMaloney on 07/03/15 and they were normal. Pt was hoping to reduce the amount daily of this medication. Pt said you told her if her enzymes remained low then you would consider reducing. Please advise if she still needs the 4 daily or if we can reduce.

## 2015-08-19 NOTE — Telephone Encounter (Signed)
She can try going down to 3 pills a day but that is the minimum recommended dose for her condition.

## 2015-08-19 NOTE — Telephone Encounter (Signed)
Tried contacting pt. No vm to leave message. 

## 2015-08-19 NOTE — Telephone Encounter (Signed)
Pt notified she can take 3 pills daily. Pt will continue 4 daily for now.

## 2015-08-30 ENCOUNTER — Other Ambulatory Visit: Payer: Self-pay | Admitting: Gastroenterology

## 2016-04-04 ENCOUNTER — Other Ambulatory Visit: Payer: Self-pay

## 2016-04-04 DIAGNOSIS — K743 Primary biliary cirrhosis: Secondary | ICD-10-CM

## 2016-04-04 MED ORDER — URSODIOL 300 MG PO CAPS: ORAL_CAPSULE | ORAL | Status: DC

## 2016-07-01 ENCOUNTER — Encounter: Payer: Self-pay | Admitting: Family Medicine

## 2016-07-01 ENCOUNTER — Ambulatory Visit (INDEPENDENT_AMBULATORY_CARE_PROVIDER_SITE_OTHER): Payer: Medicare Other | Admitting: Family Medicine

## 2016-07-01 VITALS — BP 142/74 | HR 88 | Temp 98.3°F | Resp 16 | Ht 66.5 in | Wt 176.2 lb

## 2016-07-01 DIAGNOSIS — E7800 Pure hypercholesterolemia, unspecified: Secondary | ICD-10-CM | POA: Diagnosis not present

## 2016-07-01 DIAGNOSIS — M349 Systemic sclerosis, unspecified: Secondary | ICD-10-CM | POA: Diagnosis not present

## 2016-07-01 DIAGNOSIS — Z23 Encounter for immunization: Secondary | ICD-10-CM | POA: Diagnosis not present

## 2016-07-01 DIAGNOSIS — I10 Essential (primary) hypertension: Secondary | ICD-10-CM | POA: Diagnosis not present

## 2016-07-01 DIAGNOSIS — I7300 Raynaud's syndrome without gangrene: Secondary | ICD-10-CM | POA: Diagnosis not present

## 2016-07-01 DIAGNOSIS — Z0000 Encounter for general adult medical examination without abnormal findings: Secondary | ICD-10-CM

## 2016-07-01 DIAGNOSIS — K743 Primary biliary cirrhosis: Secondary | ICD-10-CM | POA: Diagnosis not present

## 2016-07-01 NOTE — Progress Notes (Signed)
Patient: Angela Valenzuela, Female    DOB: 1951/04/05, 65 y.o.   MRN: [id number] Visit Date: 07/01/2016  Today's Provider: Dortha Kern, PA   Chief Complaint  Patient presents with  . Medicare Wellness   Subjective:   Initial preventative physical exam Angela Valenzuela is a 65 y.o. female who presents today for her Initial Preventative Physical Exam. She feels well. She reports exercising 2 times per week. She reports she is sleeping average 6-7 hours per night.  Review of Systems  Constitutional: Negative.   HENT: Negative.   Eyes: Negative.   Respiratory: Negative.   Cardiovascular: Negative.   Gastrointestinal: Negative.   Endocrine: Negative.   Genitourinary: Negative.   Musculoskeletal: Negative.   Skin: Negative.   Allergic/Immunologic: Negative.   Neurological: Negative.   Hematological: Negative.   Psychiatric/Behavioral: Negative.     Social History   Social History  . Marital status: Married    Spouse name: N/A  . Number of children: N/A  . Years of education: N/A   Occupational History  . Not on file.   Social History Main Topics  . Smoking status: Never Smoker  . Smokeless tobacco: Never Used  . Alcohol use 0.0 oz/week     Comment: occassional  . Drug use: No  . Sexual activity: Not on file   Other Topics Concern  . Not on file   Social History Narrative  . No narrative on file    Past Medical History:  Diagnosis Date  . Allergy   . Hyperlipidemia   . Hypertension   . Increased liver enzymes 03/17/2015  . Paroxysmal digital cyanosis 03/17/2015   Overview:   a.  Positive anticentromere antibody b.   Sicca-xerostomia c.  RVSP 41 in 2013   . Raynaud's disease 04/30/2015  . Thyroid disease      Patient Active Problem List   Diagnosis Date Noted  . Kidney cysts 06/24/2015  . Dermatitis, eczematoid 06/24/2015  . Dry mouth 06/24/2015  . Abnormal LFTs 06/24/2015  . Blood in the urine 06/24/2015  . Buschke's scleredema (HCC)  06/24/2015  . Subclinical hypothyroidism 06/24/2015  . Epidermoid carcinoma 06/24/2015  . Raynaud's disease 04/30/2015  . Hypertension 04/30/2015  . Hyperlipidemia 04/30/2015  . Paroxysmal digital cyanosis 03/17/2015  . Increased liver enzymes 03/17/2015  . Raynaud's syndrome without gangrene 01/09/2008  . Hypercholesteremia 09/08/2006  . Allergic rhinitis 01/21/1999  . BP (high blood pressure) 01/21/1999    No past surgical history on file.  Her family history includes Alzheimer's disease in her mother; Hypertension in her father; Stroke in her father.    Previous Medications   AMLODIPINE (NORVASC) 5 MG TABLET       CALCIUM-VITAMIN D 600-200 MG-UNIT PER TABLET    Take by mouth.   IBUPROFEN (ADVIL) 200 MG TABLET    ADVIL, 200MG  (Oral Tablet)  1 Every Day, As Needed for 0 days  Quantity: 0.00;  Refills: 0   Ordered :11-Nov-2011  Amie Critchley ;  Started 01-Jan-2008 Active Comments: Medication taken as needed.    LORATADINE (CLARITIN) 10 MG TABLET    Take 1 tablet by mouth daily.   MELOXICAM (MOBIC) 7.5 MG TABLET    Take 7.5 mg by mouth daily.   OMEPRAZOLE (PRILOSEC) 20 MG CAPSULE    Take by mouth.   RA KRILL OIL 500 MG CAPS    Take by mouth.   SODIUM FLUORIDE (DENTA 5000 PLUS) 1.1 % CREA DENTAL CREAM  URSODIOL (ACTIGALL) 300 MG CAPSULE    TAKE 1 CAPSULE (300 MG TOTAL) BY MOUTH 4 (FOUR) TIMES DAILY.    Patient Care Team: Tamsen Roers, PA as PCP - General (Family Medicine)     Objective:   Vitals: BP (!) 142/74 (BP Location: Right Arm, Patient Position: Sitting, Cuff Size: Normal)   Pulse 88   Temp 98.3 F (36.8 C) (Oral)   Resp 16   Ht 5' 6.5" (1.689 m)   Wt 176 lb 3.2 oz (79.9 kg)   BMI 28.01 kg/m   Physical Exam  Constitutional: She is oriented to person, place, and time. She appears well-developed and well-nourished.  HENT:  Head: Normocephalic and atraumatic.  Right Ear: External ear normal.  Left Ear: External ear normal.  Nose: Nose  normal.  Mouth/Throat: Oropharynx is clear and moist.  Eyes: Conjunctivae and EOM are normal. Pupils are equal, round, and reactive to light. Right eye exhibits no discharge.  Neck: Normal range of motion. Neck supple. No tracheal deviation present. No thyromegaly present.  Cardiovascular: Normal rate, regular rhythm, normal heart sounds and intact distal pulses.   No murmur heard. Pulmonary/Chest: Effort normal and breath sounds normal. No respiratory distress. She has no wheezes. She has no rales. She exhibits no tenderness.  Abdominal: Soft. She exhibits no distension and no mass. There is no tenderness. There is no rebound and no guarding.  Genitourinary:  Genitourinary Comments: Requests postponing exam and PAP.  Musculoskeletal: Normal range of motion. She exhibits no edema or tenderness.  Stiffness and Heberden's nodules on both DIP joints of index fingers.  Lymphadenopathy:    She has no cervical adenopathy.  Neurological: She is alert and oriented to person, place, and time. She has normal reflexes. No cranial nerve deficit. She exhibits normal muscle tone. Coordination normal.  Skin: Skin is warm and dry. No rash noted. No erythema.  Psychiatric: She has a normal mood and affect. Her behavior is normal. Judgment and thought content normal.    Activities of Daily Living In your present state of health, do you have any difficulty performing the following activities: 07/01/2016  Hearing? N  Vision? N  Difficulty concentrating or making decisions? N  Walking or climbing stairs? N  Dressing or bathing? N  Doing errands, shopping? N  Some recent data might be hidden    Fall Risk Assessment Fall Risk  07/01/2016 06/25/2015  Falls in the past year? No No     Depression Screen PHQ 2/9 Scores 07/01/2016 06/25/2015  PHQ - 2 Score 0 0    Cognitive Testing - 6-CIT  Correct? Score   What year is it? yes 0 0 or 4  What month is it? yes 0 0 or 3  Memorize:    Floyde Parkins,  42,   [address]  [place],      What time is it? (within 1 hour) yes 0 0 or 3  Count backwards from 20 yes 0 0, 2, or 4  Name the months of the year yes 0 0, 2, or 4  Repeat name & address above yes 0 0, 2, 4, 6, 8, or 10       TOTAL SCORE  0/28   Interpretation:  Normal  Normal (0-7) Abnormal (8-28)   Audit-C Alcohol Use Screening  Question Answer Points  How often do you have alcoholic drink? 2-4 times monthly 2  On days you do drink alcohol, how many drinks do you typically consume? 1 or 2 0  How oftey will you drink 6 or more in a total? never 0  Total Score:  2   A score of 3 or more in women, and 4 or more in men indicates increased risk for alcohol abuse, EXCEPT if all of the points are from question 1.     Assessment & Plan:     Initial Preventative Physical Exam  Reviewed patient's Family Medical History Reviewed and updated list of patient's medical providers Assessment of cognitive impairment was done Assessed patient's functional ability Established a written schedule for health screening services Health Risk Assessent Completed and Reviewed  Exercise Activities and Dietary recommendations Goals    . Exercise 150 minutes per week (moderate activity)       Immunization History  Administered Date(s) Administered  . Influenza,inj,Quad PF,36+ Mos 06/25/2015  . Tdap 01/11/2008    Health Maintenance  Topic Date Due  . HIV Screening  12/14/1965  . ZOSTAVAX  12/15/2010  . DEXA SCAN  12/15/2015  . PNA vac Low Risk Adult (1 of 2 - PCV13) 12/15/2015  . PAP SMEAR  12/28/2015  . INFLUENZA VACCINE  04/19/2016  . MAMMOGRAM  08/03/2017  . TETANUS/TDAP  01/10/2018  . COLONOSCOPY  04/22/2018  . Hepatitis C Screening  Completed      Discussed health benefits of physical activity, and encouraged her to engage in regular exercise appropriate for her age and condition.      ------------------------------------------------------------------------------------------------------------ 1. Welcome to Medicare preventive visit Good general health. Assessments normal without areas of concern. Will get flu shot today. Wants to postpone PAP and pneumonia vaccine. EKG normal. Recheck annually. - EKG 12-Lead  2. Essential hypertension Good control on the Norvasc 5 mg qd. Recheck labs next week. - CBC with Differential/Platelet; Future - Comprehensive metabolic panel; Future - Lipid panel; Future - TSH; Future  3. Raynaud's syndrome without gangrene Tolerating Norvasc daily and tries to keep hands warm. Only gets blanching of finger (especially index fingers both hands). No open sores or scabs. Recheck labs nest week. - CBC with Differential/Platelet; Future - Comprehensive metabolic panel; Future - TSH; Future  4. Buschke's scleredema (HCC) Probable cause of Raynaud's phenomena. Followed by rheumatologist every 6 months (last evaluation by Dr. Beverley [facility]W. [facility] was Sept. 2017).  - CBC with Differential/Platelet; Future - Comprehensive metabolic panel; Future  5. Hypercholesteremia Trying to follow low fat diet and exercise routinely. Still taking Krill Oil daily. Recheck labs next week. - Comprehensive metabolic panel; Future - Lipid panel; Future - TSH; Future  6. 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 Servando SnareWohl. Will get labs in the next week. Asymptomatic. - CBC with Differential/Platelet; Future - Comprehensive metabolic panel; Future - Lipid panel; Future  7. Need for influenza vaccination - Flu vaccine HIGH DOSE PF    Dortha Kernennis Chrismon, PA  [facility]

## 2016-07-29 ENCOUNTER — Other Ambulatory Visit: Payer: Self-pay | Admitting: Emergency Medicine

## 2016-07-29 DIAGNOSIS — M349 Systemic sclerosis, unspecified: Secondary | ICD-10-CM

## 2016-07-29 DIAGNOSIS — E7800 Pure hypercholesterolemia, unspecified: Secondary | ICD-10-CM

## 2016-07-29 DIAGNOSIS — I7300 Raynaud's syndrome without gangrene: Secondary | ICD-10-CM

## 2016-07-29 DIAGNOSIS — K743 Primary biliary cirrhosis: Secondary | ICD-10-CM

## 2016-07-29 DIAGNOSIS — I10 Essential (primary) hypertension: Secondary | ICD-10-CM

## 2016-07-30 LAB — COMPREHENSIVE METABOLIC PANEL
ALBUMIN: 4.3 g/dL (ref 3.6–4.8)
ALK PHOS: 126 IU/L — AB (ref 39–117)
ALT: 15 IU/L (ref 0–32)
AST: 15 IU/L (ref 0–40)
Albumin/Globulin Ratio: 2 (ref 1.2–2.2)
BUN / CREAT RATIO: 16 (ref 12–28)
BUN: 12 mg/dL (ref 8–27)
Bilirubin Total: 0.4 mg/dL (ref 0.0–1.2)
CO2: 24 mmol/L (ref 18–29)
CREATININE: 0.75 mg/dL (ref 0.57–1.00)
Calcium: 9.6 mg/dL (ref 8.7–10.3)
Chloride: 102 mmol/L (ref 96–106)
GFR calc Af Amer: 97 mL/min/{1.73_m2} (ref >59)
GFR calc non Af Amer: 84 mL/min/{1.73_m2} (ref >59)
GLUCOSE: 105 mg/dL — AB (ref 65–99)
Globulin, Total: 2.2 g/dL (ref 1.5–4.5)
Potassium: 4.3 mmol/L (ref 3.5–5.2)
Sodium: 142 mmol/L (ref 134–144)
TOTAL PROTEIN: 6.5 g/dL (ref 6.0–8.5)

## 2016-07-30 LAB — TSH: TSH: 2.93 u[IU]/mL (ref 0.450–4.500)

## 2016-07-30 LAB — CBC WITH DIFFERENTIAL/PLATELET
BASOS ABS: 0 10*3/uL (ref 0.0–0.2)
Basos: 0 %
EOS (ABSOLUTE): 0.1 10*3/uL (ref 0.0–0.4)
Eos: 2 %
HEMOGLOBIN: 13.2 g/dL (ref 11.1–15.9)
Hematocrit: 40.9 % (ref 34.0–46.6)
Immature Grans (Abs): 0 10*3/uL (ref 0.0–0.1)
Immature Granulocytes: 0 %
LYMPHS ABS: 1.2 10*3/uL (ref 0.7–3.1)
Lymphs: 26 %
MCH: 27.1 pg (ref 26.6–33.0)
MCHC: 32.3 g/dL (ref 31.5–35.7)
MCV: 84 fL (ref 79–97)
MONOCYTES: 8 %
MONOS ABS: 0.4 10*3/uL (ref 0.1–0.9)
NEUTROS ABS: 2.9 10*3/uL (ref 1.4–7.0)
Neutrophils: 64 %
PLATELETS: 255 10*3/uL (ref 150–379)
RBC: 4.87 x10E6/uL (ref 3.77–5.28)
RDW: 14 % (ref 12.3–15.4)
WBC: 4.5 10*3/uL (ref 3.4–10.8)

## 2016-07-30 LAB — LIPID PANEL
CHOLESTEROL TOTAL: 234 mg/dL — AB (ref 100–199)
Chol/HDL Ratio: 2.7 ratio units (ref 0.0–4.4)
HDL: 87 mg/dL (ref >39)
LDL CALC: 130 mg/dL — AB (ref 0–99)
Triglycerides: 87 mg/dL (ref 0–149)
VLDL CHOLESTEROL CAL: 17 mg/dL (ref 5–40)

## 2016-08-01 ENCOUNTER — Telehealth: Payer: Self-pay

## 2016-08-01 MED ORDER — SIMVASTATIN 20 MG PO TABS: 20.0000 mg | ORAL_TABLET | Freq: Every day | ORAL | 3 refills | Status: DC

## 2016-08-01 NOTE — Telephone Encounter (Signed)
-----   Message from Jodell CiproDennis E Greenvillehrismon, [place] sent at 08/01/2016  8:02 AM EST ----- Blood tests normal except alkaline phosphatase elevation (probably due to scleroderma/Raynaud's) and total cholesterol/LDL high. Need to add Simvastatin 20 mg qd #30 & 3 RF. Recheck appointment in 3 months.

## 2016-08-01 NOTE — Telephone Encounter (Signed)
Patient has been advised, appt arranged from 11/01/16. [facility] [place] KW

## 2016-08-02 ENCOUNTER — Telehealth: Payer: Self-pay

## 2016-08-02 NOTE — Telephone Encounter (Signed)
Patient has a question about her ursodiol medication. It's an insurance question. She received a letter and she is needing some clarification. Please call the patient to answer her questions

## 2016-08-02 NOTE — Telephone Encounter (Signed)
Spoke with pt regarding her Ursodial medication. Pt advised me in January her medication will change to a Tier 3 which will cost her $57.00. Told her I will call her insurance in January to request a Tier exception. Pt has enough refills to last until then.

## 2016-10-18 ENCOUNTER — Telehealth: Payer: Self-pay | Admitting: Gastroenterology

## 2016-10-18 NOTE — Telephone Encounter (Signed)
Please call patient. She has questions concerning new medication that was prescribed by Dr Orland Jarredroxler. She would like to know if these medications will be ok to take concerning her medical history with liver problems.   [phone]

## 2016-10-18 NOTE — Telephone Encounter (Signed)
The patient know that she can take this medication as long as we monitor her liver enzymes when she is taking the medication and again in 1 month's time to see if there is any change.

## 2016-10-18 NOTE — Telephone Encounter (Signed)
See below. Dr. Orland Jarredroxler prescribed pt Meloxicam 15mg  daily.

## 2016-10-18 NOTE — Telephone Encounter (Signed)
LVM for pt to return my call and advised her of Dr. Annabell SabalWohl's recommendation.

## 2016-10-19 NOTE — Telephone Encounter (Signed)
Pt returned call and advised of Dr. Annabell SabalWohl's recommendation below. Pt also wanted me to be aware she was having a tooth extracted and was going to be taking amoxicillin 500mg  daily for 21 days prior to extraction. Pt will contact me in 1 month after she has been on meloxicam to recheck liver enzymes.

## 2016-10-19 NOTE — Telephone Encounter (Signed)
Patient left a voice message that she was returning your call °

## 2016-10-28 ENCOUNTER — Other Ambulatory Visit: Payer: Self-pay | Admitting: Gastroenterology

## 2016-10-28 ENCOUNTER — Other Ambulatory Visit: Payer: Self-pay | Admitting: Family Medicine

## 2016-10-28 DIAGNOSIS — Z1231 Encounter for screening mammogram for malignant neoplasm of breast: Secondary | ICD-10-CM

## 2016-10-31 ENCOUNTER — Telehealth: Payer: Self-pay | Admitting: Family Medicine

## 2016-10-31 NOTE — Telephone Encounter (Signed)
Pt is wanting to know if she should r/s her f/u appt for a few weeks due to the flu.  She is very afraid she will catch it coming into our office.  Would like advise.

## 2016-10-31 NOTE — Telephone Encounter (Signed)
OK to reschedule in 4-6 weeks.

## 2016-10-31 NOTE — Telephone Encounter (Signed)
Patient rescheduled for 12/22/2016.

## 2016-11-01 ENCOUNTER — Ambulatory Visit: Payer: Self-pay | Admitting: Family Medicine

## 2016-11-03 ENCOUNTER — Ambulatory Visit: Payer: Self-pay | Admitting: Family Medicine

## 2016-11-16 ENCOUNTER — Ambulatory Visit
Admission: RE | Admit: 2016-11-16 | Discharge: 2016-11-16 | Disposition: A | Discharge status: Home / Self Care | Payer: Medicare Other | Source: Ambulatory Visit | Attending: Family Medicine | Admitting: Family Medicine

## 2016-11-16 ENCOUNTER — Telehealth: Payer: Self-pay | Admitting: Gastroenterology

## 2016-11-16 DIAGNOSIS — Z1231 Encounter for screening mammogram for malignant neoplasm of breast: Secondary | ICD-10-CM

## 2016-11-16 NOTE — Telephone Encounter (Signed)
Patient left a voice message for you to call her please °

## 2016-11-18 ENCOUNTER — Other Ambulatory Visit: Payer: Self-pay

## 2016-11-18 ENCOUNTER — Telehealth: Payer: Self-pay

## 2016-11-18 DIAGNOSIS — R748 Abnormal levels of other serum enzymes: Secondary | ICD-10-CM

## 2016-11-18 NOTE — Telephone Encounter (Signed)
-----   Message from Tamsen Roersennis E Chrismon, [place] sent at 11/18/2016 12:58 PM EST ----- Normal mammogram.

## 2016-11-18 NOTE — Telephone Encounter (Signed)
Patient advised.

## 2016-11-18 NOTE — Telephone Encounter (Signed)
Pt has been off of the meloxicam for 1 month. Per Dr. Servando SnareWohl, he wanted to repeat the liver enzymes after month off of the medication. Pt advised the order is in the system.

## 2016-11-22 DIAGNOSIS — M349 Systemic sclerosis, unspecified: Secondary | ICD-10-CM | POA: Insufficient documentation

## 2016-11-22 DIAGNOSIS — K743 Primary biliary cirrhosis: Secondary | ICD-10-CM | POA: Insufficient documentation

## 2016-11-24 LAB — HEPATIC FUNCTION PANEL
ALT: 13 IU/L (ref 0–32)
AST: 12 IU/L (ref 0–40)
Albumin: 4.6 g/dL (ref 3.6–4.8)
Alkaline Phosphatase: 142 IU/L — ABNORMAL HIGH (ref 39–117)
Bilirubin Total: <0.2 mg/dL (ref 0.0–1.2)
Bilirubin, Direct: 0.06 mg/dL (ref 0.00–0.40)
Total Protein: 6.5 g/dL (ref 6.0–8.5)

## 2016-11-27 ENCOUNTER — Other Ambulatory Visit: Payer: Self-pay | Admitting: Family Medicine

## 2016-12-15 ENCOUNTER — Telehealth: Payer: Self-pay

## 2016-12-15 NOTE — Telephone Encounter (Signed)
-----   Message from Midge Miniumarren Wohl, MD sent at 12/14/2016  8:12 AM EDT ----- Have patient follow-up in the office

## 2016-12-15 NOTE — Telephone Encounter (Signed)
Spoke with pt and results given. Pt has also been scheduled for a follow up appt as it's been since August of 2016 since last office visit.

## 2016-12-22 ENCOUNTER — Encounter: Payer: Self-pay | Admitting: Family Medicine

## 2016-12-22 ENCOUNTER — Ambulatory Visit (INDEPENDENT_AMBULATORY_CARE_PROVIDER_SITE_OTHER): Payer: Medicare Other | Admitting: Family Medicine

## 2016-12-22 VITALS — BP 140/80 | HR 95 | Temp 97.8°F | Resp 16 | Wt 173.0 lb

## 2016-12-22 DIAGNOSIS — K743 Primary biliary cirrhosis: Secondary | ICD-10-CM

## 2016-12-22 DIAGNOSIS — M349 Systemic sclerosis, unspecified: Secondary | ICD-10-CM | POA: Diagnosis not present

## 2016-12-22 DIAGNOSIS — I7300 Raynaud's syndrome without gangrene: Secondary | ICD-10-CM

## 2016-12-22 DIAGNOSIS — E7800 Pure hypercholesterolemia, unspecified: Secondary | ICD-10-CM

## 2016-12-22 NOTE — Progress Notes (Signed)
Patient: Angela Valenzuela Female    DOB: 12-30-1950   66 y.o.   MRN: [id number] Visit Date: 12/22/2016  Today's Provider: Dortha Kern, PA   Chief Complaint  Patient presents with  . Hyperlipidemia   Subjective:    HPI  Lipid/Cholesterol, Follow-up:   Last seen for this 6 months ago.  Management changes since that visit include started simvastatin. . Last Lipid Panel:    Component Value Date/Time   CHOL 234 (H) 07/29/2016 0901   TRIG 87 07/29/2016 0901   HDL 87 07/29/2016 0901   CHOLHDL 2.7 07/29/2016 0901   LDLCALC 130 (H) 07/29/2016 0901    Risk factors for vascular disease include hypercholesterolemia  She reports good compliance with treatment. She is having side effects, possibly. She reports that she has always had muscle cramps in her legs but they seem to have gotten worse but she is unsure if this is from the medications because she is unsure if this started when she started the simvastatin.  Current exercise: none due to tendon issue in foot.   Wt Readings from Last 3 Encounters:  12/22/16 173 lb (78.5 kg)  07/01/16 176 lb 3.2 oz (79.9 kg)  06/25/15 171 lb 6.4 oz (77.7 kg)    ------------------------------------------------------------------- Labs 07/29/16 alkaline phosphate was elevated. Lipids were elevated and Simvastatin was started. Follow up 3 months.   Past Medical History:  Diagnosis Date  . Allergy   . Hyperlipidemia   . Hypertension   . Increased liver enzymes 03/17/2015  . Paroxysmal digital cyanosis 03/17/2015   Overview:   a.  Positive anticentromere antibody b.   Sicca-xerostomia c.  RVSP 41 in 2013   . Raynaud's disease 04/30/2015  . Thyroid disease    History reviewed. No pertinent surgical history. Family History  Problem Relation Age of Onset  . Hypertension Father   . Stroke Father   . Alzheimer's disease Mother    No Known Allergies  Current Outpatient Prescriptions:  .  amLODipine (NORVASC) 5 MG tablet, , Disp: ,  Rfl:  .  Calcium-Vitamin D 600-200 MG-UNIT per tablet, Take by mouth., Disp: , Rfl:  .  RA KRILL OIL 500 MG CAPS, Take by mouth., Disp: , Rfl:  .  simvastatin (ZOCOR) 20 MG tablet, TAKE 1 TABLET (20 MG TOTAL) BY MOUTH AT BEDTIME., Disp: 30 tablet, Rfl: 3 .  ursodiol (ACTIGALL) 300 MG capsule, TAKE 1 CAPSULE (300 MG TOTAL) BY MOUTH 4 (FOUR) TIMES DAILY., Disp: 120 capsule, Rfl: 6 .  ibuprofen (ADVIL) 200 MG tablet, ADVIL,  (Oral Tablet)  1 Every Day, As Needed for 0 days  Quantity: 0.00;  Refills: 0   Ordered :11-Nov-2011  Amie Critchley ;  Started 01-Jan-2008 Active Comments: Medication taken as needed. , Disp: , Rfl:  .  loratadine (CLARITIN) 10 MG tablet, Take 1 tablet by mouth daily., Disp: , Rfl:  .  meloxicam (MOBIC) 7.5 MG tablet, Take 7.5 mg by mouth daily., Disp: , Rfl: 3 .  omeprazole (PRILOSEC) 20 MG capsule, Take by mouth., Disp: , Rfl:  .  sodium fluoride (DENTA 5000 PLUS) 1.1 % CREA dental cream, , Disp: , Rfl:   Review of Systems  Constitutional: Negative.   HENT: Negative.   Eyes: Negative.   Respiratory: Negative.   Cardiovascular: Negative.   Gastrointestinal: Negative.   Endocrine: Negative.   Genitourinary: Negative.   Musculoskeletal: Negative.   Skin: Negative.   Allergic/Immunologic: Negative.   Neurological: Negative.   Hematological: Negative.  Psychiatric/Behavioral: Negative.     Social History  Substance Use Topics  . Smoking status: Never Smoker  . Smokeless tobacco: Never Used  . Alcohol use 0.0 oz/week     Comment: occassional   Objective:   BP 140/80 (BP Location: Left Arm, Patient Position: Sitting, Cuff Size: Normal)   Pulse 95   Temp 97.8 F (36.6 C) (Oral)   Resp 16   Wt 173 lb (78.5 kg)   SpO2 98%   BMI 27.50 kg/m  Vitals:   12/22/16 0959  BP: 140/80  Pulse: 95  Resp: 16  Temp: 97.8 F (36.6 C)  TempSrc: Oral  SpO2: 98%  Weight: 173 lb (78.5 kg)   Physical Exam  Constitutional: She is oriented to person, place,  and time. She appears well-developed and well-nourished.  HENT:  Head: Normocephalic.  Eyes: Conjunctivae are normal.  Neck: Neck supple.  Cardiovascular: Normal rate and regular rhythm.   Pulmonary/Chest: Effort normal and breath sounds normal.  Abdominal: Soft. Bowel sounds are normal.  Musculoskeletal: Normal range of motion.  No muscle soreness or swelling. Good pulses. Cold fingers from Raynaud's and history of Scleroderma. Heberden's nodes in the right index finger DIP joint.  Neurological: She is alert and oriented to person, place, and time.      Assessment & Plan:     1. Hypercholesteremia Tolerating Krill Oil 500 mg qd with Simvastatin 20 mg qd with low fat diet. Recommend recheck routine labs and follow up pending reports. - CBC with Differential/Platelet - Comprehensive metabolic panel - CK Total (and CKMB)  2. Buschke's scleredema (HCC) Manifest as internal connective tissue disease. Generalize pains in legs and some in feet now. Will check labs for signs of rhabdomyolysis. - CBC with Differential/Platelet - CK Total (and CKMB)  3. Raynaud's syndrome without gangrene Usually occurs in hands or feet. Continue Norvasc 5 mg qd. Keep hands and feet protected from cold. - CBC with Differential/Platelet  4. 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 Servando Snare (gastroenterologist). Recheck CMP and follow up pending reports. No jaundice, nausea or vomiting. - Comprehensive metabolic panel       Dortha Kern, PA  [facility]

## 2016-12-22 NOTE — Patient Instructions (Signed)

## 2016-12-31 LAB — CBC WITH DIFFERENTIAL/PLATELET
BASOS: 0 %
Basophils Absolute: 0 10*3/uL (ref 0.0–0.2)
EOS (ABSOLUTE): 0.1 10*3/uL (ref 0.0–0.4)
EOS: 2 %
HEMATOCRIT: 41.4 % (ref 34.0–46.6)
Hemoglobin: 13.7 g/dL (ref 11.1–15.9)
Immature Grans (Abs): 0 10*3/uL (ref 0.0–0.1)
Immature Granulocytes: 0 %
LYMPHS ABS: 1.5 10*3/uL (ref 0.7–3.1)
Lymphs: 26 %
MCH: 27.6 pg (ref 26.6–33.0)
MCHC: 33.1 g/dL (ref 31.5–35.7)
MCV: 83 fL (ref 79–97)
MONOS ABS: 0.3 10*3/uL (ref 0.1–0.9)
Monocytes: 6 %
NEUTROS ABS: 3.7 10*3/uL (ref 1.4–7.0)
NEUTROS PCT: 66 %
PLATELETS: 282 10*3/uL (ref 150–379)
RBC: 4.97 x10E6/uL (ref 3.77–5.28)
RDW: 14.9 % (ref 12.3–15.4)
WBC: 5.6 10*3/uL (ref 3.4–10.8)

## 2016-12-31 LAB — COMPREHENSIVE METABOLIC PANEL
ALT: 19 IU/L (ref 0–32)
AST: 21 IU/L (ref 0–40)
Albumin/Globulin Ratio: 2 (ref 1.2–2.2)
Albumin: 4.5 g/dL (ref 3.6–4.8)
Alkaline Phosphatase: 128 IU/L — ABNORMAL HIGH (ref 39–117)
BUN / CREAT RATIO: 17 (ref 12–28)
BUN: 13 mg/dL (ref 8–27)
Bilirubin Total: 0.4 mg/dL (ref 0.0–1.2)
CALCIUM: 9.7 mg/dL (ref 8.7–10.3)
CHLORIDE: 100 mmol/L (ref 96–106)
CO2: 25 mmol/L (ref 18–29)
Creatinine, Ser: 0.77 mg/dL (ref 0.57–1.00)
GFR, EST AFRICAN AMERICAN: 93 mL/min/{1.73_m2} (ref >59)
GFR, EST NON AFRICAN AMERICAN: 81 mL/min/{1.73_m2} (ref >59)
GLUCOSE: 106 mg/dL — AB (ref 65–99)
Globulin, Total: 2.2 g/dL (ref 1.5–4.5)
Potassium: 4.4 mmol/L (ref 3.5–5.2)
Sodium: 142 mmol/L (ref 134–144)
TOTAL PROTEIN: 6.7 g/dL (ref 6.0–8.5)

## 2016-12-31 LAB — CK TOTAL AND CKMB (NOT AT ARMC)
CK-MB Index: 1.9 ng/mL (ref 0.0–5.3)
Total CK: 52 U/L (ref 24–173)

## 2017-01-03 ENCOUNTER — Ambulatory Visit (INDEPENDENT_AMBULATORY_CARE_PROVIDER_SITE_OTHER): Payer: Medicare Other | Admitting: Gastroenterology

## 2017-01-03 ENCOUNTER — Other Ambulatory Visit: Payer: Self-pay

## 2017-01-03 ENCOUNTER — Encounter: Payer: Self-pay | Admitting: Gastroenterology

## 2017-01-03 VITALS — BP 163/71 | HR 96 | Temp 98.0°F | Ht 66.5 in | Wt 172.4 lb

## 2017-01-03 DIAGNOSIS — K743 Primary biliary cirrhosis: Secondary | ICD-10-CM

## 2017-01-03 MED ORDER — URSODIOL 500 MG PO TABS: 500.0000 mg | ORAL_TABLET | Freq: Three times a day (TID) | ORAL | 5 refills | Status: DC

## 2017-01-03 NOTE — Progress Notes (Signed)
Primary Care Physician: Vernie Murders, PA  Primary Gastroenterologist:  Dr. Lucilla Lame  No chief complaint on file.   HPI: Angela Valenzuela is a 66 y.o. female here for follow-up of her primary biliary cirrhosis to the patient's alk phosphatase has been chronically elevated. The patient's most recent labs done on the 13th of this month showed her alkaline phosphatase to be 128. The patient was to be on Actigall 300 mg 4 times a day. The patient reports that she thought that her meloxicam may be interfering with her of her enzymes so she stopped it a few weeks prior to having her blood work done.  Current Outpatient Prescriptions  Medication Sig Dispense Refill  . amLODipine (NORVASC) 5 MG tablet     . Calcium-Vitamin D 600-200 MG-UNIT per tablet Take by mouth.    Marland Kitchen ibuprofen (ADVIL) 200 MG tablet ADVIL, 200MG (Oral Tablet)  1 Every Day, As Needed for 0 days  Quantity: 0.00;  Refills: 0   Ordered :11-Nov-2011  Angela Valenzuela ;  Started 01-Jan-2008 Active Comments: Medication taken as needed.     . loratadine (CLARITIN) 10 MG tablet Take 1 tablet by mouth daily.    . meloxicam (MOBIC) 7.5 MG tablet Take 7.5 mg by mouth daily.  3  . omeprazole (PRILOSEC) 20 MG capsule Take by mouth.    . RA KRILL OIL 500 MG CAPS Take by mouth.    . simvastatin (ZOCOR) 20 MG tablet TAKE 1 TABLET (20 MG TOTAL) BY MOUTH AT BEDTIME. 30 tablet 3  . sodium fluoride (DENTA 5000 PLUS) 1.1 % CREA dental cream     . ursodiol (ACTIGALL) 300 MG capsule TAKE 1 CAPSULE (300 MG TOTAL) BY MOUTH 4 (FOUR) TIMES DAILY. 120 capsule 6   No current facility-administered medications for this visit.     Allergies as of 01/03/2017  . (No Known Allergies)    ROS:  General: Negative for anorexia, weight loss, fever, chills, fatigue, weakness. ENT: Negative for hoarseness, difficulty swallowing , nasal congestion. CV: Negative for chest pain, angina, palpitations, dyspnea on exertion, peripheral edema.    Respiratory: Negative for dyspnea at rest, dyspnea on exertion, cough, sputum, wheezing.  GI: See history of present illness. GU:  Negative for dysuria, hematuria, urinary incontinence, urinary frequency, nocturnal urination.  Endo: Negative for unusual weight change.    Physical Examination:   There were no vitals taken for this visit.  General: Well-nourished, well-developed in no acute distress.  Eyes: No icterus. Conjunctivae pink. Mouth: Oropharyngeal mucosa moist and pink , no lesions erythema or exudate. Lungs: Clear to auscultation bilaterally. Non-labored. Heart: Regular rate and rhythm, no murmurs rubs or gallops.  Abdomen: Bowel sounds are normal, nontender, nondistended, no hepatosplenomegaly or masses, no abdominal bruits or hernia , no rebound or guarding.   Extremities: No lower extremity edema. No clubbing or deformities. Neuro: Alert and oriented x 3.  Grossly intact. Skin: Warm and dry, no jaundice.   Psych: Alert and cooperative, normal mood and affect.  Labs:    Imaging Studies: No results found.  Assessment and Plan:   Angela Valenzuela is a 66 y.o. y/o female who has PBC with increased antimitochondrial antibody. The patient continues to have elevated alkaline phosphatase. The patient will have her dose of ursodiol increased to 1500 mg a day.  The patient will follow-up in one month's time to check her liver enzymes.  If the liver enzymes continue to be elevated then the patient may need liver biopsy.  The patient has been explained the plan and agrees with it.    Lucilla Lame, MD. [facility]   Note: This dictation was prepared with Dragon dictation along with smaller phrase technology. Any transcriptional errors that result from this process are unintentional.

## 2017-01-04 LAB — SPECIMEN STATUS REPORT

## 2017-01-04 LAB — LIPID PANEL W/O CHOL/HDL RATIO
CHOLESTEROL TOTAL: 200 mg/dL — AB (ref 100–199)
HDL: 92 mg/dL (ref >39)
LDL CALC: 90 mg/dL (ref 0–99)
TRIGLYCERIDES: 92 mg/dL (ref 0–149)
VLDL CHOLESTEROL CAL: 18 mg/dL (ref 5–40)

## 2017-01-05 ENCOUNTER — Telehealth: Payer: Self-pay | Admitting: Gastroenterology

## 2017-01-05 ENCOUNTER — Encounter: Payer: Self-pay | Admitting: Family Medicine

## 2017-01-05 NOTE — Telephone Encounter (Signed)
Patient left a voice message for you to call her please. No reason given °

## 2017-01-05 NOTE — Telephone Encounter (Signed)
LVM for pt to return my call.

## 2017-01-06 NOTE — Telephone Encounter (Signed)
Called pt again and answered questions regarding her medication that was given during her last office. All questions answered.

## 2017-02-08 LAB — HEPATIC FUNCTION PANEL
ALK PHOS: 124 IU/L — AB (ref 39–117)
ALT: 17 IU/L (ref 0–32)
AST: 17 IU/L (ref 0–40)
Albumin: 4.8 g/dL (ref 3.6–4.8)
Bilirubin Total: 0.3 mg/dL (ref 0.0–1.2)
Bilirubin, Direct: 0.09 mg/dL (ref 0.00–0.40)
Total Protein: 6.7 g/dL (ref 6.0–8.5)

## 2017-02-09 ENCOUNTER — Telehealth: Payer: Self-pay

## 2017-02-09 NOTE — Telephone Encounter (Signed)
Pt notified of lab results. Pt would like to know when we should repeat liver enzymes again. Please advise.

## 2017-02-09 NOTE — Telephone Encounter (Signed)
-----   Message from Midge Miniumarren Wohl, MD sent at 02/08/2017  8:14 AM EDT ----- Let the patient know her alkaline phosphatase has gone down slightly

## 2017-02-27 ENCOUNTER — Telehealth: Payer: Self-pay | Admitting: Family Medicine

## 2017-02-27 ENCOUNTER — Other Ambulatory Visit: Payer: Self-pay | Admitting: Family Medicine

## 2017-02-27 DIAGNOSIS — E782 Mixed hyperlipidemia: Secondary | ICD-10-CM

## 2017-02-27 MED ORDER — SIMVASTATIN 20 MG PO TABS: 20.0000 mg | ORAL_TABLET | Freq: Every day | ORAL | 3 refills | Status: DC

## 2017-02-27 NOTE — Telephone Encounter (Signed)
[facility] the following medications:  simvastatin (ZOCOR) 20 MG tablet.  Take 1 tablet by mouth at bedtime.  90 day supply.  [facility] [place]

## 2017-02-28 ENCOUNTER — Other Ambulatory Visit: Payer: Self-pay

## 2017-02-28 ENCOUNTER — Encounter: Payer: Self-pay | Admitting: Family Medicine

## 2017-02-28 ENCOUNTER — Ambulatory Visit (INDEPENDENT_AMBULATORY_CARE_PROVIDER_SITE_OTHER): Payer: Medicare Other | Admitting: Family Medicine

## 2017-02-28 VITALS — BP 158/78 | HR 98 | Temp 99.0°F | Wt 174.4 lb

## 2017-02-28 DIAGNOSIS — J302 Other seasonal allergic rhinitis: Secondary | ICD-10-CM

## 2017-02-28 NOTE — Patient Instructions (Signed)
Allergic Rhinitis Allergic rhinitis is when the mucous membranes in the nose respond to allergens. Allergens are particles in the air that cause your body to have an allergic reaction. This causes you to release allergic antibodies. Through a chain of events, these eventually cause you to release histamine into the blood stream. Although meant to protect the body, it is this release of histamine that causes your discomfort, such as frequent sneezing, congestion, and an itchy, runny nose. What are the causes? Seasonal allergic rhinitis (hay fever) is caused by pollen allergens that may come from grasses, trees, and weeds. Year-round allergic rhinitis (perennial allergic rhinitis) is caused by allergens such as house dust mites, pet dander, and mold spores. What are the signs or symptoms?  Nasal stuffiness (congestion).  Itchy, runny nose with sneezing and tearing of the eyes. How is this diagnosed? Your health care provider can help you determine the allergen or allergens that trigger your symptoms. If you and your health care provider are unable to determine the allergen, skin or blood testing may be used. Your health care provider will diagnose your condition after taking your health history and performing a physical exam. Your health care provider may assess you for other related conditions, such as asthma, pink eye, or an ear infection. How is this treated? Allergic rhinitis does not have a cure, but it can be controlled by:  Medicines that block allergy symptoms. These may include allergy shots, nasal sprays, and oral antihistamines.  Avoiding the allergen. Hay fever may often be treated with antihistamines in pill or nasal spray forms. Antihistamines block the effects of histamine. There are over-the-counter medicines that may help with nasal congestion and swelling around the eyes. Check with your health care provider before taking or giving this medicine. If avoiding the allergen or the  medicine prescribed do not work, there are many new medicines your health care provider can prescribe. Stronger medicine may be used if initial measures are ineffective. Desensitizing injections can be used if medicine and avoidance does not work. Desensitization is when a patient is given ongoing shots until the body becomes less sensitive to the allergen. Make sure you follow up with your health care provider if problems continue. Follow these instructions at home: It is not possible to completely avoid allergens, but you can reduce your symptoms by taking steps to limit your exposure to them. It helps to know exactly what you are allergic to so that you can avoid your specific triggers. Contact a health care provider if:  You have a fever.  You develop a cough that does not stop easily (persistent).  You have shortness of breath.  You start wheezing.  Symptoms interfere with normal daily activities. This information is not intended to replace advice given to you by your health care provider. Make sure you discuss any questions you have with your health care provider. Document Released: 05/31/2001 Document Revised: 05/06/2016 Document Reviewed: 05/13/2013 Elsevier Interactive Patient Education  2017 Elsevier Inc.  

## 2017-02-28 NOTE — Progress Notes (Signed)
Patient: Angela Valenzuela Female    DOB: 1951-01-25   66 y.o.   MRN: [id number] Visit Date: 02/28/2017  Today's Provider: Dortha Kern, PA   Chief Complaint  Patient presents with  . Cough  . Allergic Rhinitis    Subjective:    URI   This is a new problem. Episode onset: Sunday. The problem has been unchanged. Associated symptoms include coughing, rhinorrhea, sneezing and a sore throat (scratchy and dry). She has tried nothing for the symptoms.   Patient Active Problem List   Diagnosis Date Noted  . Primary biliary cholangitis (HCC) 11/22/2016  . Scleroderma (HCC) 11/22/2016  . Hepatic cirrhosis due to primary biliary cholangitis (HCC) 07/01/2016  . Kidney cysts 06/24/2015  . Dermatitis, eczematoid 06/24/2015  . Dry mouth 06/24/2015  . Abnormal LFTs 06/24/2015  . Blood in the urine 06/24/2015  . 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 Subclinical hypothyroidism 06/24/2015  . Epidermoid carcinoma 06/24/2015  . Raynaud's disease 04/30/2015  . Hypertension 04/30/2015  . Hyperlipidemia 04/30/2015  . Paroxysmal digital cyanosis 03/17/2015  . Increased liver enzymes 03/17/2015  . Raynaud's syndrome without gangrene 01/09/2008  . Hypercholesteremia 09/08/2006  . Allergic rhinitis 01/21/1999  . BP (high blood pressure) 01/21/1999   Past Medical History:  Diagnosis Date  . Allergy   . Hyperlipidemia   . Hypertension   . Increased liver enzymes 03/17/2015  . Paroxysmal digital cyanosis 03/17/2015   Overview:   a.  Positive anticentromere antibody b.   Sicca-xerostomia c.  RVSP 41 in 2013   . Raynaud's disease 04/30/2015  . Thyroid disease    No past surgical history on file.   Family History  Problem Relation Age of Onset  . Hypertension Father   . Stroke Father   . Alzheimer's disease Mother    No Known Allergies   Previous Medications   AMLODIPINE (NORVASC) 5 MG TABLET   Take one tablet daily by mouth    CALCIUM-VITAMIN D 600-200 MG-UNIT PER TABLET    Take by  mouth.   IBUPROFEN (ADVIL) 200 MG TABLET    ADVIL, 200MG  (Oral Tablet)  1 Every Day, As Needed for 0 days  Quantity: 0.00;  Refills: 0   Ordered :11-Nov-2011  Amie Critchley ;  Started 01-Jan-2008 Active Comments: Medication taken as needed.    LORATADINE (CLARITIN) 10 MG TABLET    Take 1 tablet by mouth daily.   MELOXICAM (MOBIC) 7.5 MG TABLET    Take 7.5 mg by mouth daily.   OMEPRAZOLE (PRILOSEC) 20 MG CAPSULE    Take by mouth.   PREVIDENT 5000 DRY MOUTH 1.1 % GEL DENTAL GEL    USE AS DIRECTED 1 TIME DAILY   RA KRILL OIL 500 MG CAPS    Take by mouth.   SIMVASTATIN (ZOCOR) 20 MG TABLET    Take 1 tablet (20 mg total) by mouth at bedtime.   SODIUM FLUORIDE (DENTA 5000 PLUS) 1.1 % CREA DENTAL CREAM       URSODIOL (ACTIGALL) 300 MG CAPSULE    TAKE 1 CAPSULE (300 MG TOTAL) BY MOUTH 4 (FOUR) TIMES DAILY.   URSODIOL (ACTIGALL) 500 MG TABLET    Take 1 tablet (500 mg total) by mouth 3 (three) times daily.    Review of Systems  Constitutional: Negative.   HENT: Positive for rhinorrhea, sneezing and sore throat (scratchy and dry).   Respiratory: Positive for cough.   Cardiovascular: Negative.     Social History  Substance Use Topics  . Smoking status:  Never Smoker  . Smokeless tobacco: Never Used  . Alcohol use 0.0 oz/week     Comment: occassional   Objective:   BP (!) 158/78 (BP Location: Right Arm, Patient Position: Sitting, Cuff Size: Normal)   Pulse 98   Temp 99 F (37.2 C) (Oral)   Wt 174 lb 6.4 oz (79.1 kg)   SpO2 96%   BMI 27.73 kg/m   Physical Exam  Constitutional: She is oriented to person, place, and time. She appears well-developed and well-nourished. No distress.  HENT:  Head: Normocephalic and atraumatic.  Right Ear: Hearing and external ear normal.  Left Ear: Hearing and external ear normal.  Nose: Nose normal.  Mouth/Throat: Oropharynx is clear and moist.  Good transillumination throughout all sinuses. No tenderness.  Eyes: Conjunctivae and lids are  normal. Right eye exhibits no discharge. Left eye exhibits no discharge. No scleral icterus.  Neck: Neck supple.  Cardiovascular: Normal rate and regular rhythm.   Pulmonary/Chest: Effort normal and breath sounds normal. No respiratory distress.  Abdominal: Soft. Bowel sounds are normal.  Musculoskeletal: Normal range of motion.  Lymphadenopathy:    She has no cervical adenopathy.  Neurological: She is alert and oriented to person, place, and time.  Skin: Skin is intact. No lesion and no rash noted.  Psychiatric: She has a normal mood and affect. Her speech is normal and behavior is normal. Thought content normal.      Assessment & Plan:     1. Seasonal allergic rhinitis, unspecified trigger Onset over the past 3 days. History of some sneezing, rhinorrhea and itchy nose most spring and fall seasons. This episode seemed to be triggered by receiving some new boxed up furniture with heavy wood stain vapors recently. May use Claritin 10 mg qd and add Fluticasone Nasal Spray at bedtime with Delsym prn cough. Recheck prn.

## 2017-03-08 ENCOUNTER — Telehealth: Payer: Self-pay | Admitting: Family Medicine

## 2017-03-08 NOTE — Telephone Encounter (Signed)
Pt was in last Tuesday with with sinus stuff.  Thought to be allergies.  She is feeling worse and wants to know if an antibiotic can be called in or does she need to be seen again.  [facility]  She know Maurine MinisterDennis is out today of the office today and will wait until tomorrow if necessary.  Pt call back is [phone]  Thanks teri

## 2017-03-08 NOTE — Telephone Encounter (Signed)
Please review

## 2017-03-09 MED ORDER — AMOXICILLIN 875 MG PO TABS: 875.0000 mg | ORAL_TABLET | Freq: Two times a day (BID) | ORAL | 0 refills | Status: DC

## 2017-03-09 NOTE — Telephone Encounter (Signed)
Can send in an antibiotic prescription (be sure of which pharmacy - Amoxicillin 875 mg BID#20) and schedule follow up appointment in 2 weeks if no better.

## 2017-03-09 NOTE — Telephone Encounter (Signed)
[facility] Patient advised.  

## 2017-07-04 ENCOUNTER — Encounter: Payer: Self-pay | Admitting: Family Medicine

## 2017-07-14 ENCOUNTER — Ambulatory Visit (INDEPENDENT_AMBULATORY_CARE_PROVIDER_SITE_OTHER): Payer: Medicare Other | Admitting: Family Medicine

## 2017-07-14 ENCOUNTER — Encounter: Payer: Self-pay | Admitting: Family Medicine

## 2017-07-14 VITALS — BP 138/68 | HR 80 | Temp 98.5°F | Resp 16 | Ht 66.5 in | Wt 174.0 lb

## 2017-07-14 DIAGNOSIS — Z0000 Encounter for general adult medical examination without abnormal findings: Secondary | ICD-10-CM

## 2017-07-14 DIAGNOSIS — Z780 Asymptomatic menopausal state: Secondary | ICD-10-CM | POA: Diagnosis not present

## 2017-07-14 DIAGNOSIS — Z13820 Encounter for screening for osteoporosis: Secondary | ICD-10-CM

## 2017-07-14 DIAGNOSIS — Z23 Encounter for immunization: Secondary | ICD-10-CM | POA: Diagnosis not present

## 2017-07-14 DIAGNOSIS — G6289 Other specified polyneuropathies: Secondary | ICD-10-CM | POA: Diagnosis not present

## 2017-07-14 DIAGNOSIS — G629 Polyneuropathy, unspecified: Secondary | ICD-10-CM | POA: Insufficient documentation

## 2017-07-14 NOTE — Progress Notes (Signed)
Patient: Angela Valenzuela, Female    DOB: September 24, 1950, 66 y.o.   MRN: [id number] Visit Date: 07/14/2017  Today's Provider: [name], MD   Chief Complaint  Patient presents with  . Annual Wellness Exam  . Numbness   Subjective:    Annual wellness visit Angela Valenzuela is a 66 y.o. female. She feels well. She reports exercising not regularly . She reports she is sleeping well.  Last colonoscopy in 2009 - repeat in 10 years Cervical cancer screening: NIL, HPV - in 2014, never had abnormal Mammogram: 10/2015 BIRADS 1    Numbness in feet: Patient reports that she has had numbness sensation in her feet that has worsened over the last 6 months. She reports that she has the sensation in both of her feet. She says that it is not constant, it comes and goes. Not tingling, does not want to call it numbness, no pain.  States that it feels like a pulling sensation when walking.  Doesn't interfere with daily activities.  No h/o diabetes or alcohol drinking  Review of Systems  Constitutional: Negative.   HENT: Negative.   Eyes: Negative.   Respiratory: Negative.   Cardiovascular: Negative.   Gastrointestinal: Negative.   Endocrine: Negative.   Genitourinary: Negative.   Musculoskeletal: Negative.   Skin: Negative.   Allergic/Immunologic: Negative.   Neurological: Negative.   Hematological: Negative.   Psychiatric/Behavioral: Negative.     Social History   Social History  . Marital status: Married    Spouse name: N/A  . Number of children: N/A  . Years of education: N/A   Occupational History  . Not on file.   Social History Main Topics  . Smoking status: Never Smoker  . Smokeless tobacco: Never Used  . Alcohol use 0.0 oz/week     Comment: occassional  . Drug use: No  . Sexual activity: Not on file   Other Topics Concern  . Not on file   Social History Narrative  . No narrative on file    Past Medical History:  Diagnosis Date  . Allergy   .  Hyperlipidemia   . Hypertension   . Increased liver enzymes 03/17/2015  . Paroxysmal digital cyanosis 03/17/2015   Overview:   a.  Positive anticentromere antibody b.   Sicca-xerostomia c.  RVSP 41 in 2013   . Raynaud's disease 04/30/2015  . Thyroid disease      Patient Active Problem List   Diagnosis Date Noted  . Peripheral neuropathy 07/14/2017  . Primary biliary cholangitis (HCC) 11/22/2016  . Scleroderma (HCC) 11/22/2016  . Hepatic cirrhosis due to primary biliary cholangitis (HCC) 07/01/2016  . Kidney cysts 06/24/2015  . Dermatitis, eczematoid 06/24/2015  . Dry mouth 06/24/2015  . Abnormal LFTs 06/24/2015  . Blood in the urine 06/24/2015  . 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 Subclinical hypothyroidism 06/24/2015  . Epidermoid carcinoma 06/24/2015  . Raynaud's disease 04/30/2015  . Hypertension 04/30/2015  . Hyperlipidemia 04/30/2015  . Paroxysmal digital cyanosis 03/17/2015  . Raynaud's syndrome without gangrene 01/09/2008  . Allergic rhinitis 01/21/1999  . BP (high blood pressure) 01/21/1999    No past surgical history on file.  Her family history includes Alzheimer's disease in her mother; Hypertension in her father; Stroke in her father.      Current Outpatient Prescriptions:  .  amLODipine (NORVASC) 5 MG tablet, , Disp: , Rfl:  .  amoxicillin (AMOXIL) 875 MG tablet, Take 1 tablet (875 mg total) by mouth 2 (  two) times daily., Disp: 20 tablet, Rfl: 0 .  Calcium-Vitamin D 600-200 MG-UNIT per tablet, Take by mouth., Disp: , Rfl:  .  ibuprofen (ADVIL) 200 MG tablet, ADVIL, 200MG  (Oral Tablet)  1 Every Day, As Needed for 0 days  Quantity: 0.00;  Refills: 0   Ordered :11-Nov-2011  Amie Critchley ;  Started 01-Jan-2008 Active Comments: Medication taken as needed. , Disp: , Rfl:  .  loratadine (CLARITIN) 10 MG tablet, Take 1 tablet by mouth daily., Disp: , Rfl:  .  omeprazole (PRILOSEC) 20 MG capsule, Take by mouth., Disp: , Rfl:  .  PREVIDENT 5000 DRY MOUTH 1.1 %  GEL dental gel, USE AS DIRECTED 1 TIME DAILY, Disp: , Rfl: 3 .  RA KRILL OIL 500 MG CAPS, Take by mouth., Disp: , Rfl:  .  simvastatin (ZOCOR) 20 MG tablet, Take 1 tablet (20 mg total) by mouth at bedtime., Disp: 90 tablet, Rfl: 3 .  sodium fluoride (DENTA 5000 PLUS) 1.1 % CREA dental cream, , Disp: , Rfl:  .  ursodiol (ACTIGALL) 300 MG capsule, TAKE 1 CAPSULE (300 MG TOTAL) BY MOUTH 4 (FOUR) TIMES DAILY., Disp: 120 capsule, Rfl: 6 .  ursodiol (ACTIGALL) 500 MG tablet, Take 1 tablet (500 mg total) by mouth 3 (three) times daily., Disp: 90 tablet, Rfl: 5 .  meloxicam (MOBIC) 7.5 MG tablet, Take 7.5 mg by mouth daily., Disp: , Rfl: 3  Patient Care Team: Erasmo Downer, MD as PCP - General (Family Medicine)     Objective:   Vitals: BP 138/68 (BP Location: Right Arm, Patient Position: Sitting, Cuff Size: Normal)   Pulse 80   Temp 98.5 F (36.9 C)   Resp 16   Ht 5' 6.5" (1.689 m)   Wt 174 lb (78.9 kg)   BMI 27.66 kg/m   Physical Exam  Constitutional: She is oriented to person, place, and time. She appears well-developed and well-nourished. No distress.  HENT:  Head: Normocephalic and atraumatic.  Right Ear: Tympanic membrane, external ear and ear canal normal.  Left Ear: Tympanic membrane, external ear and ear canal normal.  Nose: Nose normal.  Mouth/Throat: Oropharynx is clear and moist. No oropharyngeal exudate.  Eyes: Conjunctivae are normal. No scleral icterus.  Neck: Neck supple. No thyromegaly present.  Cardiovascular: Normal rate, regular rhythm, normal heart sounds and intact distal pulses.   No murmur heard. Pulmonary/Chest: Effort normal and breath sounds normal. No respiratory distress. She has no wheezes. She has no rales.  Abdominal: Soft. Bowel sounds are normal. She exhibits no distension. There is no tenderness. There is no rebound and no guarding.  Musculoskeletal: She exhibits no edema or deformity.  B/l feet: loss of trasverse arch, L foot with collapse of  longitudinal arch.  Sensation intact to light touch.  No TTP along metatarsals, toes, arch, or plantar fascia  Lymphadenopathy:    She has no cervical adenopathy.  Neurological: She is alert and oriented to person, place, and time. No cranial nerve deficit.  Skin: Skin is warm and dry. No rash noted.  Psychiatric: She has a normal mood and affect. Her behavior is normal.  Vitals reviewed.   Activities of Daily Living In your present state of health, do you have any difficulty performing the following activities: 07/14/2017  Hearing? N  Vision? N  Difficulty concentrating or making decisions? N  Walking or climbing stairs? N  Dressing or bathing? N  Doing errands, shopping? N  Some recent data might be hidden  Fall Risk Assessment Fall Risk  07/14/2017 07/01/2016 06/25/2015  Falls in the past year? No No No     Depression Screen PHQ 2/9 Scores 07/14/2017 07/01/2016 06/25/2015  PHQ - 2 Score 0 0 0  PHQ- 9 Score 0 - -    Cognitive Testing - 6-CIT 6CIT Screen 07/14/2017  What Year? 0 points  What month? 0 points  What time? 0 points  Count back from 20 0 points  Months in reverse 0 points  Repeat phrase 0 points  Total Score 0        Assessment & Plan:     Annual Wellness Visit  Reviewed patient's Family Medical History Reviewed and updated list of patient's medical providers Assessment of cognitive impairment was done Assessed patient's functional ability Established a written schedule for health screening services Health Risk Assessent Completed and Reviewed  Exercise Activities and Dietary recommendations Goals    . Exercise 150 minutes per week (moderate activity)       Immunization History  Administered Date(s) Administered  . Influenza Split 08/08/2012  . Influenza, High Dose Seasonal PF 07/01/2016, 07/14/2017  . Influenza,inj,Quad PF,6+ Mos 07/27/2013, 08/12/2014, 06/25/2015  . Pneumococcal Polysaccharide-23 07/14/2017  . Tdap 01/11/2008     Health Maintenance  Topic Date Due  . DEXA SCAN  12/15/2015  . PNA vac Low Risk Adult (1 of 2 - PCV13) 12/15/2015  . INFLUENZA VACCINE  04/19/2017  . TETANUS/TDAP  01/10/2018  . COLONOSCOPY  04/22/2018  . MAMMOGRAM  11/16/2018  . Hepatitis C Screening  Completed     Discussed health benefits of physical activity, and encouraged her to engage in regular exercise appropriate for her age and condition.     Problem List Items Addressed This Visit      Nervous and Auditory   Peripheral neuropathy    Likely related to loss of transverse arch No DM or alcohol intake to explain Check B12 levels, other labs wnl recently Advised orthotics with metatarsal pads and arch support, avoid high heels Consider referral to sports medicine in the future F/u in 3 months      Relevant Orders   B12    Other Visit Diagnoses    Medicare annual wellness visit, subsequent    -  Primary   Influenza vaccine needed       Relevant Orders   Flu vaccine HIGH DOSE PF (Fluzone High dose) (Completed)   Osteoporosis screening       Relevant Orders   DG Bone Density   Postmenopausal       Relevant Orders   DG Bone Density   Need for pneumococcal vaccine       Relevant Orders   Pneumococcal polysaccharide vaccine 23-valent greater than or equal to 2yo subcutaneous/IM (Completed)      ------------------------------------------------------------------------------------------------------------ Return in about 3 months (around 10/14/2017).   The entirety of the information documented in the History of Present Illness, Review of Systems and Physical Exam were personally obtained by me. Portions of this information were initially documented by Anson [place]achelle Presley, CMA and reviewed by me for thoroughness and accuracy.     Shirlee LatchAngela Bacigalupo, MD  [facility]

## 2017-07-14 NOTE — Patient Instructions (Addendum)
Check for orthotics (shoe inserts) at [facility] in [place] You need arch support and metatarsal pads   Preventive Care 66 Years and Older, Female Preventive care refers to lifestyle choices and visits with your health care provider that can promote health and wellness. What does preventive care include?  A yearly physical exam. This is also called an annual well check.  Dental exams once or twice a year.  Routine eye exams. Ask your health care provider how often you should have your eyes checked.  Personal lifestyle choices, including: ? Daily care of your teeth and gums. ? Regular physical activity. ? Eating a healthy diet. ? Avoiding tobacco and drug use. ? Limiting alcohol use. ? Practicing safe sex. ? Taking low-dose aspirin every day. ? Taking vitamin and mineral supplements as recommended by your health care provider. What happens during an annual well check? The services and screenings done by your health care provider during your annual well check will depend on your age, overall health, lifestyle risk factors, and family history of disease. Counseling Your health care provider may ask you questions about your:  Alcohol use.  Tobacco use.  Drug use.  Emotional well-being.  Home and relationship well-being.  Sexual activity.  Eating habits.  History of falls.  Memory and ability to understand (cognition).  Work and work Statistician.  Reproductive health.  Screening You may have the following tests or measurements:  Height, weight, and BMI.  Blood pressure.  Lipid and cholesterol levels. These may be checked every 5 years, or more frequently if you are over 88 years old.  Skin check.  Lung cancer screening. You may have this screening every year starting at age 72 if you have a 30-pack-year history of smoking and currently smoke or have quit within the past 15 years.  Fecal occult blood test (FOBT) of the stool. You may have this test every  year starting at age 42.  Flexible sigmoidoscopy or colonoscopy. You may have a sigmoidoscopy every 5 years or a colonoscopy every 10 years starting at age 60.  Hepatitis C blood test.  Hepatitis B blood test.  Sexually transmitted disease (STD) testing.  Diabetes screening. This is done by checking your blood sugar (glucose) after you have not eaten for a while (fasting). You may have this done every 1-3 years.  Bone density scan. This is done to screen for osteoporosis. You may have this done starting at age 47.  Mammogram. This may be done every 1-2 years. Talk to your health care provider about how often you should have regular mammograms.  Talk with your health care provider about your test results, treatment options, and if necessary, the need for more tests. Vaccines Your health care provider may recommend certain vaccines, such as:  Influenza vaccine. This is recommended every year.  Tetanus, diphtheria, and acellular pertussis (Tdap, Td) vaccine. You may need a Td booster every 10 years.  Varicella vaccine. You may need this if you have not been vaccinated.  Zoster vaccine. You may need this after age 49.  Measles, mumps, and rubella (MMR) vaccine. You may need at least one dose of MMR if you were born in 1957 or later. You may also need a second dose.  Pneumococcal 13-valent conjugate (PCV13) vaccine. One dose is recommended after age 4.  Pneumococcal polysaccharide (PPSV23) vaccine. One dose is recommended after age 57.  Meningococcal vaccine. You may need this if you have certain conditions.  Hepatitis A vaccine. You may need this if  you have certain conditions or if you travel or work in places where you may be exposed to hepatitis A.  Hepatitis B vaccine. You may need this if you have certain conditions or if you travel or work in places where you may be exposed to hepatitis B.  Haemophilus influenzae type b (Hib) vaccine. You may need this if you have certain  conditions.  Talk to your health care provider about which screenings and vaccines you need and how often you need them. This information is not intended to replace advice given to you by your health care provider. Make sure you discuss any questions you have with your health care provider. Document Released: 10/02/2015 Document Revised: 05/25/2016 Document Reviewed: 07/07/2015 Elsevier Interactive Patient Education  2017 [employer].

## 2017-07-14 NOTE — Assessment & Plan Note (Signed)
Likely related to loss of transverse arch No DM or alcohol intake to explain Check B12 levels, other labs wnl recently Advised orthotics with metatarsal pads and arch support, avoid high heels Consider referral to sports medicine in the future F/u in 3 months

## 2017-07-15 LAB — VITAMIN B12: VITAMIN B 12: 261 pg/mL (ref 200–1100)

## 2017-07-19 ENCOUNTER — Other Ambulatory Visit: Payer: Self-pay | Admitting: Gastroenterology

## 2017-07-24 NOTE — Progress Notes (Signed)
Advised  ED 

## 2017-08-17 ENCOUNTER — Ambulatory Visit: Payer: Medicare Other | Admitting: Family Medicine

## 2017-08-17 VITALS — BP 120/64 | HR 92 | Temp 98.0°F | Resp 16 | Wt 174.0 lb

## 2017-08-17 DIAGNOSIS — G6289 Other specified polyneuropathies: Secondary | ICD-10-CM | POA: Diagnosis not present

## 2017-08-17 DIAGNOSIS — M7742 Metatarsalgia, left foot: Secondary | ICD-10-CM

## 2017-08-17 DIAGNOSIS — M7741 Metatarsalgia, right foot: Secondary | ICD-10-CM | POA: Diagnosis not present

## 2017-08-17 DIAGNOSIS — M76822 Posterior tibial tendinitis, left leg: Secondary | ICD-10-CM | POA: Diagnosis not present

## 2017-08-17 DIAGNOSIS — M7740 Metatarsalgia, unspecified foot: Secondary | ICD-10-CM | POA: Insufficient documentation

## 2017-08-17 NOTE — Assessment & Plan Note (Signed)
Improving with metatarsal pads and arch support Continue this, ASO brace prn, HEP as given F/u as scheduled in 09/2017 Can consider sports medicine referral if not improved

## 2017-08-17 NOTE — Progress Notes (Signed)
Patient: Angela BlazerLinda M Valenzuela Female    DOB: 10/24/1950   66 y.o.   MRN: [id number] Visit Date: 08/17/2017  Today's Provider: Shirlee LatchAngela Ercelle Winkles, MD   Chief Complaint  Patient presents with  . Foot Pain   Subjective:    Foot Pain  Episode onset: about 1 year. The problem occurs intermittently. The problem has been gradually worsening. Pertinent negatives include no chest pain, chills, diaphoresis, fatigue or fever. The symptoms are aggravated by walking and standing. Treatments tried: "brace" that was prescribed by podiatry in January 2018, pads, arch support. The treatment provided significant relief.   Has been using ASO brace that lifts pressure off of tendon and helps relieve the pain.  She notes that it was tender and swollen along insertion site of posterior tibial tendon last night, but has gone down this morning.  She is wearing hard soled shoes as directed by podiatry.    No Known Allergies   Current Outpatient Medications:  .  amLODipine (NORVASC) 5 MG tablet, , Disp: , Rfl:  .  calcium carbonate (TUMS EX) 750 MG chewable tablet, Chew 1 tablet by mouth daily., Disp: , Rfl:  .  Calcium-Vitamin D 600-200 MG-UNIT per tablet, Take by mouth., Disp: , Rfl:  .  ibuprofen (ADVIL) 200 MG tablet, ADVIL, 200MG  (Oral Tablet)  1 Every Day, As Needed for 0 days  Quantity: 0.00;  Refills: 0   Ordered :11-Nov-2011  Amie CritchleyMitchell, Stephanie ;  Started 01-Jan-2008 Active Comments: Medication taken as needed. , Disp: , Rfl:  .  loratadine (CLARITIN) 10 MG tablet, Take 1 tablet by mouth daily., Disp: , Rfl:  .  meloxicam (MOBIC) 7.5 MG tablet, Take 7.5 mg by mouth daily., Disp: , Rfl: 3 .  omeprazole (PRILOSEC) 20 MG capsule, Take by mouth., Disp: , Rfl:  .  PREVIDENT 5000 DRY MOUTH 1.1 % GEL dental gel, USE AS DIRECTED 1 TIME DAILY, Disp: , Rfl: 3 .  RA KRILL OIL 500 MG CAPS, Take by mouth., Disp: , Rfl:  .  simvastatin (ZOCOR) 20 MG tablet, Take 1 tablet (20 mg total) by mouth at bedtime., Disp:  90 tablet, Rfl: 3 .  sodium fluoride (DENTA 5000 PLUS) 1.1 % CREA dental cream, , Disp: , Rfl:  .  ursodiol (ACTIGALL) 500 MG tablet, TAKE 1 TABLET (500 MG TOTAL) BY MOUTH 3 (THREE) TIMES DAILY., Disp: 90 tablet, Rfl: 5  Review of Systems  Constitutional: Negative for activity change, appetite change, chills, diaphoresis, fatigue, fever and unexpected weight change.  Respiratory: Negative for shortness of breath.   Cardiovascular: Negative for chest pain, palpitations and leg swelling.  Musculoskeletal: Negative for gait problem.    Social History   Tobacco Use  . Smoking status: Never Smoker  . Smokeless tobacco: Never Used  Substance Use Topics  . Alcohol use: Yes    Alcohol/week: 0.0 oz    Comment: occassional   Objective:   BP 120/64 (BP Location: Left Arm, Patient Position: Sitting, Cuff Size: Normal)   Pulse 92   Temp 98 F (36.7 C) (Oral)   Resp 16   Wt 174 lb (78.9 kg)   BMI 27.66 kg/m  Vitals:   08/17/17 0810  BP: 120/64  Pulse: 92  Resp: 16  Temp: 98 F (36.7 C)  TempSrc: Oral  Weight: 174 lb (78.9 kg)     Physical Exam  Constitutional: She is oriented to person, place, and time. She appears well-developed and well-nourished. No distress.  HENT:  Head: Normocephalic and atraumatic.  Cardiovascular: Normal rate, regular rhythm and intact distal pulses.  Pulmonary/Chest: Effort normal. No respiratory distress.  Musculoskeletal: She exhibits no edema or deformity.  B/l feet: loss of trasverse arch, L foot with collapse of longitudinal arch.  Sensation intact to light touch.  No TTP along metatarsals, toes, arch, or plantar fascia. Not tender along course of posterior tibial tendon, but reports this is where pain is typically located  Neurological: She is alert and oriented to person, place, and time.  Sensation to light touch improved in toes of bilateral feet  Skin: Skin is warm and dry. No rash noted.  Psychiatric: She has a normal mood and affect. Her  behavior is normal.        Assessment & Plan:      Problem List Items Addressed This Visit      Nervous and Auditory   Peripheral neuropathy - Primary    Has greatly improved with metatarsal pad and arch support  Likely was related to Morton's neuroma Labs wnl, no DM, no alcohol intake F/u as planned in 09/2017 Can consider sports med referral if not improved further        Musculoskeletal and Integument   Posterior tibial tendinitis of left leg    Pain in left ankle c/w posterior tibial tendonitis Chronic intermittent problem for patient She does have pes planus that is contributing Continue arch support and hard soled shoes Can use ASO brace as needed when active Home exercise program given with stretching and strengthening exercises Advised to ice after exercises and with pain NSAIDs prn for pain F/u as scheduled in 09/2017 Can consider sports medicine referral if not improved        Other   Metatarsalgia    Improving with metatarsal pads and arch support Continue this, ASO brace prn, HEP as given F/u as scheduled in 09/2017 Can consider sports medicine referral if not improved            The entirety of the information documented in the History of Present Illness, Review of Systems and Physical Exam were personally obtained by me. Portions of this information were initially documented by Irving BurtonEmily Ratchford, CMA and reviewed by me for thoroughness and accuracy.     Shirlee LatchAngela Sheril Hammond, MD  [facility]

## 2017-08-17 NOTE — Assessment & Plan Note (Signed)
Pain in left ankle c/w posterior tibial tendonitis Chronic intermittent problem for patient She does have pes planus that is contributing Continue arch support and hard soled shoes Can use ASO brace as needed when active Home exercise program given with stretching and strengthening exercises Advised to ice after exercises and with pain NSAIDs prn for pain F/u as scheduled in 09/2017 Can consider sports medicine referral if not improved

## 2017-08-17 NOTE — Assessment & Plan Note (Signed)
Has greatly improved with metatarsal pad and arch support  Likely was related to Morton's neuroma Labs wnl, no DM, no alcohol intake F/u as planned in 09/2017 Can consider sports med referral if not improved further

## 2017-08-30 ENCOUNTER — Other Ambulatory Visit: Payer: Medicare Other

## 2017-09-04 ENCOUNTER — Other Ambulatory Visit: Payer: Medicare Other

## 2017-10-10 ENCOUNTER — Ambulatory Visit
Admission: RE | Admit: 2017-10-10 | Discharge: 2017-10-10 | Disposition: A | Discharge status: Home / Self Care | Payer: Medicare Other | Source: Ambulatory Visit | Attending: Family Medicine | Admitting: Family Medicine

## 2017-10-10 DIAGNOSIS — Z780 Asymptomatic menopausal state: Secondary | ICD-10-CM

## 2017-10-10 DIAGNOSIS — Z13820 Encounter for screening for osteoporosis: Secondary | ICD-10-CM | POA: Diagnosis not present

## 2017-10-10 DIAGNOSIS — M85852 Other specified disorders of bone density and structure, left thigh: Secondary | ICD-10-CM | POA: Diagnosis not present

## 2017-10-11 ENCOUNTER — Telehealth: Payer: Self-pay

## 2017-10-11 NOTE — Telephone Encounter (Signed)
-----   Message from Erasmo DownerAngela M Bacigalupo, MD sent at 10/11/2017  3:47 PM EST ----- Bone density is somewhat decreased.  Not osteoporosis, but osteopenia.  Recommend dietary or supplemental calcium of 1200mg  daily and vitamin D 1000 units daily.  Also weight bearing exercises, like walking can help strengthen the bones.  We can recheck in 2 years to make sure it doesn't get worse.  Erasmo DownerBacigalupo, Angela M, MD, MPH [facility] 10/11/2017 3:47 PM

## 2017-10-11 NOTE — Telephone Encounter (Signed)
Pt advised and agrees with plan. 

## 2017-10-17 ENCOUNTER — Ambulatory Visit: Payer: Self-pay | Admitting: Family Medicine

## 2017-10-18 ENCOUNTER — Encounter: Payer: Self-pay | Admitting: Family Medicine

## 2017-10-18 ENCOUNTER — Ambulatory Visit: Payer: Medicare Other | Admitting: Family Medicine

## 2017-10-18 VITALS — BP 132/60 | HR 94 | Temp 98.2°F | Resp 16 | Wt 175.0 lb

## 2017-10-18 DIAGNOSIS — M7742 Metatarsalgia, left foot: Secondary | ICD-10-CM

## 2017-10-18 DIAGNOSIS — M7741 Metatarsalgia, right foot: Secondary | ICD-10-CM | POA: Diagnosis not present

## 2017-10-18 DIAGNOSIS — G6289 Other specified polyneuropathies: Secondary | ICD-10-CM

## 2017-10-18 DIAGNOSIS — M85852 Other specified disorders of bone density and structure, left thigh: Secondary | ICD-10-CM | POA: Insufficient documentation

## 2017-10-18 DIAGNOSIS — M76822 Posterior tibial tendinitis, left leg: Secondary | ICD-10-CM | POA: Diagnosis not present

## 2017-10-18 DIAGNOSIS — M810 Age-related osteoporosis without current pathological fracture: Secondary | ICD-10-CM | POA: Insufficient documentation

## 2017-10-18 NOTE — Assessment & Plan Note (Signed)
Improving See plan above

## 2017-10-18 NOTE — Progress Notes (Signed)
Patient: Angela Valenzuela Female    DOB: 12/09/1950   67 y.o.   MRN: [id number] Visit Date: 10/18/2017  Today's Provider: [name], MD   I, Joslyn Hy, CMA, am acting as scribe for [name], MD.  Chief Complaint  Patient presents with  . Peripheral Neuropathy   Subjective:    HPI     Follow up for Peripheral Neuropathy/ foot pain  The patient was last seen for this 3 months ago. Changes made at last visit include continuing arch support, metatarsal pads and HEP.  She reports good compliance with treatment. She feels that condition is Improved. She states her left and ankle are about 50% improved. She states the tingling has improved, but is still present.  She is no longer needing her ankle brace and the podiatrist gave her for her posterior tibial tendinitis ------------------------------------------------------------------------------------ Osteopenia: Patient recently got results from her bone density scan. She wants to do more weightbearing exercise as I recommended but she is worried this would exacerbate her foot pain.  Her exercise tolerance has been limited for the last 14-15 months since seeing podiatrist for posterior tibial tendinitis.  She does not smoke and is taking/eating enough Ca/Vit D daily.  States higher dose of supplemental calcium seems to have given constipation.   No Known Allergies   Current Outpatient Medications:  .  amLODipine (NORVASC) 5 MG tablet, , Disp: , Rfl:  .  calcium carbonate (TUMS EX) 750 MG chewable tablet, Chew 1 tablet by mouth daily., Disp: , Rfl:  .  Calcium-Vitamin D 600-200 MG-UNIT per tablet, Take by mouth., Disp: , Rfl:  .  ibuprofen (ADVIL) 200 MG tablet, ADVIL, 200MG  (Oral Tablet)  1 Every Day, As Needed for 0 days  Quantity: 0.00;  Refills: 0   Ordered :11-Nov-2011  Amie Critchley ;  Started 01-Jan-2008 Active Comments: Medication taken as needed. , Disp: , Rfl:  .  PREVIDENT 5000 DRY MOUTH 1.1  % GEL dental gel, USE AS DIRECTED 1 TIME DAILY, Disp: , Rfl: 3 .  RA KRILL OIL 500 MG CAPS, Take by mouth., Disp: , Rfl:  .  simvastatin (ZOCOR) 20 MG tablet, Take 1 tablet (20 mg total) by mouth at bedtime., Disp: 90 tablet, Rfl: 3 .  ursodiol (ACTIGALL) 500 MG tablet, TAKE 1 TABLET (500 MG TOTAL) BY MOUTH 3 (THREE) TIMES DAILY., Disp: 90 tablet, Rfl: 5 .  loratadine (CLARITIN) 10 MG tablet, Take 1 tablet by mouth daily., Disp: , Rfl:  .  meloxicam (MOBIC) 7.5 MG tablet, Take 7.5 mg by mouth daily., Disp: , Rfl: 3  Review of Systems  Constitutional: Negative for activity change, appetite change, chills, diaphoresis, fatigue, fever and unexpected weight change.  Cardiovascular: Negative for chest pain, palpitations and leg swelling.  Musculoskeletal: Negative for gait problem.  Neurological: Negative for numbness.  Hematological: Positive for adenopathy.    Social History   Tobacco Use  . Smoking status: Never Smoker  . Smokeless tobacco: Never Used  Substance Use Topics  . Alcohol use: Yes    Alcohol/week: 0.0 oz    Comment: occassional   Objective:   BP 132/60 (BP Location: Left Arm, Patient Position: Sitting, Cuff Size: Large)   Pulse 94   Temp 98.2 F (36.8 C) (Oral)   Resp 16   Wt 175 lb (79.4 kg)   SpO2 95%   BMI 27.82 kg/m  Vitals:   10/18/17 0808  BP: 132/60  Pulse: 94  Resp: 16  Temp: 98.2 F (36.8 C)  TempSrc: Oral  SpO2: 95%  Weight: 175 lb (79.4 kg)     Physical Exam  Constitutional: She is oriented to person, place, and time. She appears well-developed and well-nourished. No distress.  HENT:  Head: Normocephalic and atraumatic.  Cardiovascular: Normal rate, regular rhythm, normal heart sounds and intact distal pulses.  No murmur heard. Pulmonary/Chest: Effort normal and breath sounds normal. No respiratory distress. She has no wheezes. She has no rales.  Musculoskeletal: She exhibits no edema.  B/l feet: loss of trasverse arch, L foot with collapse  of longitudinal arch.  Sensation intact to light touch.  No TTP along metatarsals, toes, arch, or plantar fascia. Not tender along course of posterior tibial tendon, but reports this is where pain is typically located. L bunion and overriding toes  Neurological: She is alert and oriented to person, place, and time.  Skin: Skin is warm and dry. No rash noted.  Psychiatric: She has a normal mood and affect. Her behavior is normal.  Vitals reviewed.      Assessment & Plan:   Problem List Items Addressed This Visit      Nervous and Auditory   Peripheral neuropathy    Much improved with metatarsal pads and arch support Likely related to Morton's neuroma Not painful anymore and tolerable Continue with current plan Sports med referral as below        Musculoskeletal and Integument   Posterior tibial tendinitis of left leg    Chronic and intermittent tendinopathy Improving with HEP and supportive shoes Continue arch support and hard soled shoes Continue HEP and ice, NSAIDs prn Referral to sports medicine as she does feel progress is slow and would like to consider custom orthotics      Relevant Orders   Ambulatory referral to Sports Medicine   Osteopenia of neck of left femur    Discussed recommendations for weight bearing exercise - discussed not hurting herself in the process, avoid tobacco, healthy weight management and Ca/Vit D Discussed more dietary and less supplemental calcium to help with constipation Plan to recheck DEXA in 2 years        Other   Metatarsalgia - Primary    Improving See plan above      Relevant Orders   Ambulatory referral to Sports Medicine       Return in about 3 months (around 01/16/2018) for chronic disease f/u.   The entirety of the information documented in the History of Present Illness, Review of Systems and Physical Exam were personally obtained by me. Portions of this information were initially documented by Irving BurtonEmily Ratchford, CMA and  reviewed by me for thoroughness and accuracy.    Erasmo DownerBacigalupo, Randon Somera M, MD, MPH [facility] 10/18/2017 12:24 PM

## 2017-10-18 NOTE — Assessment & Plan Note (Signed)
Chronic and intermittent tendinopathy Improving with HEP and supportive shoes Continue arch support and hard soled shoes Continue HEP and ice, NSAIDs prn Referral to sports medicine as she does feel progress is slow and would like to consider custom orthotics

## 2017-10-18 NOTE — Assessment & Plan Note (Signed)
Discussed recommendations for weight bearing exercise - discussed not hurting herself in the process, avoid tobacco, healthy weight management and Ca/Vit D Discussed more dietary and less supplemental calcium to help with constipation Plan to recheck DEXA in 2 years

## 2017-10-18 NOTE — Patient Instructions (Signed)
Bone Health Bones protect organs, store calcium, and anchor muscles. Good health habits, such as eating nutritious foods and exercising regularly, are important for maintaining healthy bones. They can also help to prevent a condition that causes bones to lose density and become weak and brittle (osteoporosis). Why is bone mass important? Bone mass refers to the amount of bone tissue that you have. The higher your bone mass, the stronger your bones. An important step toward having healthy bones throughout life is to have strong and dense bones during childhood. A young adult who has a high bone mass is more likely to have a high bone mass later in life. Bone mass at its greatest it is called peak bone mass. A large decline in bone mass occurs in older adults. In women, it occurs about the time of menopause. During this time, it is important to practice good health habits, because if more bone is lost than what is replaced, the bones will become less healthy and more likely to break (fracture). If you find that you have a low bone mass, you may be able to prevent osteoporosis or further bone loss by changing your diet and lifestyle. How can I find out if my bone mass is low? Bone mass can be measured with an X-ray test that is called a bone mineral density (BMD) test. This test is recommended for all women who are age 65 or older. It may also be recommended for men who are age 70 or older, or for people who are more likely to develop osteoporosis due to:  Having bones that break easily.  Having a long-term disease that weakens bones, such as kidney disease or rheumatoid arthritis.  Having menopause earlier than normal.  Taking medicine that weakens bones, such as steroids, thyroid hormones, or hormone treatment for breast cancer or prostate cancer.  Smoking.  Drinking three or more alcoholic drinks each day.  What are the nutritional recommendations for healthy bones? To have healthy bones, you  need to get enough of the right minerals and vitamins. Most nutrition experts recommend getting these nutrients from the foods that you eat. Nutritional recommendations vary from person to person. Ask your health care provider what is healthy for you. Here are some general guidelines. Calcium Recommendations Calcium is the most important (essential) mineral for bone health. Most people can get enough calcium from their diet, but supplements may be recommended for people who are at risk for osteoporosis. Good sources of calcium include:  Dairy products, such as low-fat or nonfat milk, cheese, and yogurt.  Dark green leafy vegetables, such as bok choy and broccoli.  Calcium-fortified foods, such as orange juice, cereal, bread, soy beverages, and tofu products.  Nuts, such as almonds.  Follow these recommended amounts for daily calcium intake:  Children, age 1?3: 700 mg.  Children, age 4?8: 1,000 mg.  Children, age 9?13: 1,300 mg.  Teens, age 14?18: 1,300 mg.  Adults, age 19?50: 1,000 mg.  Adults, age 51?70: ? Men: 1,000 mg. ? Women: 1,200 mg.  Adults, age 71 or older: 1,200 mg.  Pregnant and breastfeeding females: ? Teens: 1,300 mg. ? Adults: 1,000 mg.  Vitamin D Recommendations Vitamin D is the most essential vitamin for bone health. It helps the body to absorb calcium. Sunlight stimulates the skin to make vitamin D, so be sure to get enough sunlight. If you live in a cold climate or you do not get outside often, your health care provider may recommend that you take vitamin   D supplements. Good sources of vitamin D in your diet include:  Egg yolks.  Saltwater fish.  Milk and cereal fortified with vitamin D.  Follow these recommended amounts for daily vitamin D intake:  Children and teens, age 1?18: 600 international units.  Adults, age 50 or younger: 400-800 international units.  Adults, age 51 or older: 800-1,000 international units.  Other Nutrients Other nutrients  for bone health include:  Phosphorus. This mineral is found in meat, poultry, dairy foods, nuts, and legumes. The recommended daily intake for adult men and adult women is 700 mg.  Magnesium. This mineral is found in seeds, nuts, dark green vegetables, and legumes. The recommended daily intake for adult men is 400?420 mg. For adult women, it is 310?320 mg.  Vitamin K. This vitamin is found in green leafy vegetables. The recommended daily intake is 120 mg for adult men and 90 mg for adult women.  What type of physical activity is best for building and maintaining healthy bones? Weight-bearing and strength-building activities are important for building and maintaining peak bone mass. Weight-bearing activities cause muscles and bones to work against gravity. Strength-building activities increases muscle strength that supports bones. Weight-bearing and muscle-building activities include:  Walking and hiking.  Jogging and running.  Dancing.  Gym exercises.  Lifting weights.  Tennis and racquetball.  Climbing stairs.  Aerobics.  Adults should get at least 30 minutes of moderate physical activity on most days. Children should get at least 60 minutes of moderate physical activity on most days. Ask your health care provide what type of exercise is best for you. Where can I find more information? For more information, check out the following websites:  National Osteoporosis Foundation: http://nof.org/learn/basics  National Institutes of Health: http://www.niams.nih.gov/Health_Info/Bone/Bone_Health/bone_health_for_life.asp  This information is not intended to replace advice given to you by your health care provider. Make sure you discuss any questions you have with your health care provider. Document Released: 11/26/2003 Document Revised: 03/25/2016 Document Reviewed: 09/10/2014 Elsevier Interactive Patient Education  2018 Elsevier Inc.  

## 2017-10-18 NOTE — Assessment & Plan Note (Signed)
Much improved with metatarsal pads and arch support Likely related to Morton's neuroma Not painful anymore and tolerable Continue with current plan Sports med referral as below

## 2017-10-30 ENCOUNTER — Ambulatory Visit: Payer: Medicare Other | Admitting: Sports Medicine

## 2017-11-01 ENCOUNTER — Encounter: Payer: Self-pay | Admitting: Family Medicine

## 2017-11-06 ENCOUNTER — Encounter: Payer: Self-pay | Admitting: Sports Medicine

## 2017-11-06 ENCOUNTER — Ambulatory Visit: Payer: Medicare Other | Admitting: Sports Medicine

## 2017-11-06 VITALS — BP 142/82 | Ht 66.0 in | Wt 174.0 lb

## 2017-11-06 DIAGNOSIS — M2140 Flat foot [pes planus] (acquired), unspecified foot: Secondary | ICD-10-CM | POA: Diagnosis not present

## 2017-11-06 DIAGNOSIS — M76829 Posterior tibial tendinitis, unspecified leg: Secondary | ICD-10-CM

## 2017-11-06 NOTE — Progress Notes (Signed)
   Subjective:    Patient ID: Angela BlazerLinda M Valenzuela, female    DOB: 1950/10/30, 67 y.o.   MRN: [id number]  HPI chief complaint: Left foot pain  Angela QuinLinda is a very pleasant 67 year old female that comes in today complaining of left foot pain which has been present for about a year. She initially saw podiatry a little over a year ago. She was given an ASO brace which she wears intermittently. She was also given some sort of insert for her left shoe but she was not pleased with her treatment and decided not to return. She then saw her primary care physician a few months later. Primary care physician sent her for SuperFeet inserts which have helped tremendously. She wears them in a low profile type of shoe similar to a flat. These are very comfortable. She also has a pair of Shon BatonBrooks which she will wear from time to time but she does not like them as well. Her PCP also gave her some posterior tibialis tendon exercises which have been tremendously helpful. In fact, she has very little pain in her foot today. She feels like the exercises have been most helpful but the orthotics and the ASO have also been helping. Her main complaint is tingling in both feet which is diffuse beginning in the mid foot both dorsally and on the plantar aspect and radiating into her toes. At time the tingling will progress into her ankles. She seems to notice it more with sitting. Less noticeable with walking or standing. No pain at night. No prior foot or ankle surgery.  Past medical history reviewed Medications reviewed Allergies reviewed    Review of Systems    as above Objective:   Physical Exam  Well-developed, well-nourished. No acute distress. Awake alert and oriented 3. Vital signs reviewed  Left foot: Pes planus with standing. Definite posterior tibialis tendon insufficiency. She is not tender to palpation. She does have fairly good calcaneal inversion when standing on her tiptoes. No significant calcaneal valgus seen.  Transverse arch collapse with forefoot abduction. No soft tissue swelling. Negative Tinel's at the tarsal tunnel. Good pulses. Sensation intact to light touch grossly.  Right foot: Fairly well-preserved longitudinal arch. She does have a collapsed transverse arch. Functional posterior tibialis tendon. Good pulses. No soft tissue swelling.  Patient walks without a limp.      Assessment & Plan:   Left foot pain secondary to posterior tibialis tendon insufficiency  Patient's treatment to date has been excellent. As a result, she has no residual pain. I'm not sure what is causing her neuropathic foot pain but it does not appear to be bothersome enough to work it up. I did remove the metatarsal pads from her super feet inserts (she's not really having any metatarsal pain). I gave her a replacement set of metatarsal pads to use if she changes her mind. She will continue with her home exercises and intermittent wearing of her ASO. She is asking about returning to recreational walking and I've advised her to start slowly but stop if her pain starts to return. Since she does not wear her Shon BatonBrooks regularly I don't know that she would benefit from a custom orthotic. But I did explain to her that if she is to resume recreational walking she will definitely need good arch support. Follow-up as needed.

## 2017-11-14 ENCOUNTER — Other Ambulatory Visit: Payer: Self-pay | Admitting: Family Medicine

## 2017-11-14 DIAGNOSIS — Z1231 Encounter for screening mammogram for malignant neoplasm of breast: Secondary | ICD-10-CM

## 2017-12-06 ENCOUNTER — Ambulatory Visit: Payer: Medicare Other

## 2017-12-27 ENCOUNTER — Telehealth: Payer: Self-pay

## 2017-12-27 ENCOUNTER — Ambulatory Visit
Admission: RE | Admit: 2017-12-27 | Discharge: 2017-12-27 | Disposition: A | Discharge status: Home / Self Care | Payer: Medicare Other | Source: Ambulatory Visit | Attending: Family Medicine | Admitting: Family Medicine

## 2017-12-27 DIAGNOSIS — Z1231 Encounter for screening mammogram for malignant neoplasm of breast: Secondary | ICD-10-CM

## 2017-12-27 NOTE — Telephone Encounter (Signed)
-----   Message from Erasmo DownerAngela M Bacigalupo, MD sent at 12/27/2017  4:51 PM EDT ----- Normal mammogram  Bacigalupo, Marzella SchleinAngela M, MD, MPH [facility] 12/27/2017 4:51 PM

## 2017-12-27 NOTE — Telephone Encounter (Signed)
Pt advised.

## 2018-01-13 ENCOUNTER — Other Ambulatory Visit: Payer: Self-pay | Admitting: Gastroenterology

## 2018-01-16 ENCOUNTER — Ambulatory Visit: Payer: Medicare Other | Admitting: Family Medicine

## 2018-02-06 ENCOUNTER — Ambulatory Visit: Payer: Medicare Other | Admitting: Family Medicine

## 2018-02-14 ENCOUNTER — Ambulatory Visit: Payer: Medicare Other | Admitting: Family Medicine

## 2018-03-12 ENCOUNTER — Ambulatory Visit: Payer: Medicare Other | Admitting: Family Medicine

## 2018-03-18 ENCOUNTER — Other Ambulatory Visit: Payer: Self-pay | Admitting: Family Medicine

## 2018-03-26 ENCOUNTER — Encounter: Payer: Self-pay | Admitting: Family Medicine

## 2018-03-26 ENCOUNTER — Ambulatory Visit: Payer: Medicare Other | Admitting: Family Medicine

## 2018-03-26 VITALS — BP 142/70 | HR 89 | Temp 98.4°F | Resp 16 | Wt 179.0 lb

## 2018-03-26 DIAGNOSIS — K743 Primary biliary cirrhosis: Secondary | ICD-10-CM

## 2018-03-26 DIAGNOSIS — E039 Hypothyroidism, unspecified: Secondary | ICD-10-CM

## 2018-03-26 DIAGNOSIS — E782 Mixed hyperlipidemia: Secondary | ICD-10-CM | POA: Diagnosis not present

## 2018-03-26 DIAGNOSIS — I10 Essential (primary) hypertension: Secondary | ICD-10-CM | POA: Diagnosis not present

## 2018-03-26 DIAGNOSIS — G6289 Other specified polyneuropathies: Secondary | ICD-10-CM | POA: Diagnosis not present

## 2018-03-26 DIAGNOSIS — E038 Other specified hypothyroidism: Secondary | ICD-10-CM

## 2018-03-26 NOTE — Assessment & Plan Note (Signed)
Followed by GI - Dr Laurann MontanaWohl Recheck LFTs today Encourage patient to make follow-up appointment with GI

## 2018-03-26 NOTE — Assessment & Plan Note (Signed)
Remains elevated and above goal today Patient does seem to have some whitecoat hypertension and her home blood pressure readings are well controlled Continue amlodipine 5 mg No medication changes Check CMP Follow-up in about 6 months

## 2018-03-26 NOTE — Assessment & Plan Note (Signed)
Followed by GI as above Recheck LFTs Recommend patient make follow-up appointment with GI

## 2018-03-26 NOTE — Assessment & Plan Note (Signed)
Currently on simvastatin low-dose If needed to increase dose pending lipid panel, would consider atorvastatin given that she is taking amlodipine Recheck fasting lipid panel and CMP Advised patient that her simvastatin is unlikely to be causing her neuropathy

## 2018-03-26 NOTE — Progress Notes (Signed)
Patient: Angela Valenzuela Female    DOB: [birth year]   67 y.o.   MRN: [id number] Visit Date: 03/26/2018  Today's Provider: [name], MD   I, Martha Clan, CMA, am acting as scribe for [name], MD.  Chief Complaint  Patient presents with  . Hypertension  . Hyperlipidemia   Subjective:    HPI      Hypertension, follow-up:  BP Readings from Last 3 Encounters:  03/26/18 (!) 142/70  11/06/17 (!) 142/82  10/18/17 132/60    She was last seen for hypertension on 07/01/2016. BP at that visit was 142/74. Management since that visit includes continuing amlodipine 5 mg. She reports good compliance with treatment. She is not having side effects.  She is not exercising. Due to foot pain. She is adherent to low salt diet.   Outside blood pressures are normal 110s-130/70s-80s per pt. State she has white coat syndrome. She is experiencing none.  Patient denies chest pain, chest pressure/discomfort, claudication, dyspnea, exertional chest pressure/discomfort, fatigue, irregular heart beat, lower extremity edema, near-syncope, orthopnea, palpitations and syncope.   Cardiovascular risk factors include advanced age (older than 74 for men, 54 for women), dyslipidemia and hypertension.  Use of agents associated with hypertension: NSAIDS.     Weight trend: fluctuating a bit Wt Readings from Last 3 Encounters:  03/26/18 179 lb (81.2 kg)  11/06/17 174 lb (78.9 kg)  10/18/17 175 lb (79.4 kg)    Current diet: in general, a "healthy" diet    ------------------------------------------------------------------------  Lipid/Cholesterol, Follow-up:   Last seen for this more than 1 year ago.  Management changes since that visit include continue simvastatin and krill oil. . Last Lipid Panel:    Component Value Date/Time   CHOL 200 (H) 12/30/2016 0909   TRIG 92 12/30/2016 0909   HDL 92 12/30/2016 0909   CHOLHDL 2.7 07/29/2016 0901   LDLCALC 90 12/30/2016 0909     Risk factors for vascular disease include hypercholesterolemia and hypertension  She reports good compliance with treatment. She is having side effects. She believes that the simvastatin could be causing her neuropathy.  She denies myalgias.  ------------------------------------------------------------------- Pt would like to discuss magnesium. She is wondering if this would be beneficial for her neuropathy. Her foot pain is improving, but she continues to have intermittent tingling in balls of feet and toes b/l.  She was seen by Sports Med who recommended arch support.  She is concerned that she is not exercising due to foot pain.  Patient has h/o elevated Alk Phos with PBC and cirrhosis.  She has been followed by Dr. Allen Norris - last appt in 12/2016.  He noted at that time that if Alk Phos remained elevated, she may need liver biopsy.  No Known Allergies   Current Outpatient Medications:  .  amLODipine (NORVASC) 5 MG tablet, , Disp: , Rfl:  .  calcium carbonate (TUMS EX) 750 MG chewable tablet, Chew 1 tablet by mouth daily., Disp: , Rfl:  .  Calcium-Vitamin D 600-200 MG-UNIT per tablet, Take by mouth., Disp: , Rfl:  .  PREVIDENT 5000 DRY MOUTH 1.1 % GEL dental gel, USE AS DIRECTED 1 TIME DAILY, Disp: , Rfl: 3 .  RA KRILL OIL 500 MG CAPS, Take by mouth., Disp: , Rfl:  .  simvastatin (ZOCOR) 20 MG tablet, TAKE 1 TABLET BY MOUTH EVERYDAY AT BEDTIME, Disp: 90 tablet, Rfl: 3 .  ursodiol (ACTIGALL) 500 MG tablet, TAKE 1 TABLET (500 MG TOTAL) BY MOUTH  3 (THREE) TIMES DAILY., Disp: 90 tablet, Rfl: 5 .  ibuprofen (ADVIL) 200 MG tablet, ADVIL, 200MG (Oral Tablet)  1 Every Day, As Needed for 0 days  Quantity: 0.00;  Refills: 0   Ordered :11-Nov-2011  Edmonia James ;  Started 01-Jan-2008 Active Comments: Medication taken as needed. , Disp: , Rfl:  .  loratadine (CLARITIN) 10 MG tablet, Take 1 tablet by mouth daily., Disp: , Rfl:  .  meloxicam (MOBIC) 7.5 MG tablet, Take 7.5 mg by mouth daily.,  Disp: , Rfl: 3  Review of Systems  Constitutional: Negative for activity change, appetite change, chills, diaphoresis, fatigue, fever and unexpected weight change.  Respiratory: Negative for shortness of breath.   Cardiovascular: Negative for chest pain, palpitations and leg swelling.  Musculoskeletal: Positive for gait problem.    Social History   Tobacco Use  . Smoking status: Never Smoker  . Smokeless tobacco: Never Used  Substance Use Topics  . Alcohol use: Yes    Alcohol/week: 0.0 oz    Comment: occassional   Objective:   BP (!) 142/70 (BP Location: Left Arm, Patient Position: Sitting, Cuff Size: Large)   Pulse 89   Temp 98.4 F (36.9 C) (Oral)   Resp 16   Wt 179 lb (81.2 kg)   SpO2 98%   BMI 28.89 kg/m  Vitals:   03/26/18 0947  BP: (!) 142/70  Pulse: 89  Resp: 16  Temp: 98.4 F (36.9 C)  TempSrc: Oral  SpO2: 98%  Weight: 179 lb (81.2 kg)     Physical Exam  Constitutional: She is oriented to person, place, and time. She appears well-developed and well-nourished. No distress.  HENT:  Head: Normocephalic and atraumatic.  Eyes: Conjunctivae are normal. No scleral icterus.  Cardiovascular: Normal rate, regular rhythm, normal heart sounds and intact distal pulses.  No murmur heard. Pulmonary/Chest: Effort normal and breath sounds normal. No respiratory distress. She has no wheezes. She has no rales.  Musculoskeletal: She exhibits no edema.  Left foot: Pes planus with standing. No TTP. STransverse arch collapse. No soft tissue swelling. Negative Tinel's at the tarsal tunnel. Good pulses. Sensation intact to light touch grossly. Right foot: Fairly well-preserved longitudinal arch. Collapsed transverse arch. No TTP. Good pulses. No soft tissue swelling. Antalgic gait  Neurological: She is alert and oriented to person, place, and time.  Skin: Skin is warm and dry. Capillary refill takes less than 2 seconds. No rash noted.  Psychiatric: She has a normal mood and  affect. Her behavior is normal.  Vitals reviewed.      Assessment & Plan:   Problem List Items Addressed This Visit      Cardiovascular and Mediastinum   Hypertension - Primary    Remains elevated and above goal today Patient does seem to have some whitecoat hypertension and her home blood pressure readings are well controlled Continue amlodipine 5 mg No medication changes Check CMP Follow-up in about 6 months        Digestive   Hepatic cirrhosis due to primary biliary cholangitis (HCC)    Followed by GI - Dr Judithann Graves LFTs today Encourage patient to make follow-up appointment with GI      Relevant Orders   Comprehensive metabolic panel   Primary biliary cholangitis ([place])    Followed by GI as above Recheck LFTs Recommend patient make follow-up appointment with GI      Relevant Orders   Comprehensive metabolic panel     Endocrine   Subclinical hypothyroidism  Recheck TSH today Unable to see previously elevated TSH If remains normal for this check, we will remove this problem from her problem list      Relevant Orders   TSH     Nervous and Auditory   Peripheral neuropathy    Ongoing, but mild Previously checked a B12 level which was normal She has had great improvement with treatment of her posterior tibial tendinitis and arch support No additional work-up needed at this time Suspect that this is a mechanical issue due to loss of transverse arches        Other   Hyperlipidemia    Currently on simvastatin low-dose If needed to increase dose pending lipid panel, would consider atorvastatin given that she is taking amlodipine Recheck fasting lipid panel and CMP Advised patient that her simvastatin is unlikely to be causing her neuropathy      Relevant Orders   Lipid panel       Return in about 4 months (around 07/27/2018) for CPE/AWV.   The entirety of the information documented in the History of Present Illness, Review of Systems and Physical  Exam were personally obtained by me. Portions of this information were initially documented by Emily Ratchford, CMA and reviewed by me for thoroughness and accuracy.    Bacigalupo, Angela M, MD, MPH [facility] 03/26/2018 10:57 AM    

## 2018-03-26 NOTE — Assessment & Plan Note (Signed)
Ongoing, but mild Previously checked a B12 level which was normal She has had great improvement with treatment of her posterior tibial tendinitis and arch support No additional work-up needed at this time Suspect that this is a mechanical issue due to loss of transverse arches

## 2018-03-26 NOTE — Assessment & Plan Note (Signed)
Recheck TSH today Unable to see previously elevated TSH If remains normal for this check, we will remove this problem from her problem list

## 2018-03-28 ENCOUNTER — Telehealth: Payer: Self-pay

## 2018-03-28 LAB — COMPREHENSIVE METABOLIC PANEL
ALK PHOS: 115 IU/L (ref 39–117)
ALT: 15 IU/L (ref 0–32)
AST: 17 IU/L (ref 0–40)
Albumin/Globulin Ratio: 2.4 — ABNORMAL HIGH (ref 1.2–2.2)
Albumin: 4.6 g/dL (ref 3.6–4.8)
BUN/Creatinine Ratio: 20 (ref 12–28)
BUN: 14 mg/dL (ref 8–27)
Bilirubin Total: 0.3 mg/dL (ref 0.0–1.2)
CALCIUM: 9.4 mg/dL (ref 8.7–10.3)
CO2: 22 mmol/L (ref 20–29)
Chloride: 105 mmol/L (ref 96–106)
Creatinine, Ser: 0.7 mg/dL (ref 0.57–1.00)
GFR calc Af Amer: 104 mL/min/{1.73_m2} (ref >59)
GFR, EST NON AFRICAN AMERICAN: 90 mL/min/{1.73_m2} (ref >59)
Globulin, Total: 1.9 g/dL (ref 1.5–4.5)
Glucose: 105 mg/dL — ABNORMAL HIGH (ref 65–99)
POTASSIUM: 4.5 mmol/L (ref 3.5–5.2)
Sodium: 144 mmol/L (ref 134–144)
Total Protein: 6.5 g/dL (ref 6.0–8.5)

## 2018-03-28 LAB — LIPID PANEL
CHOLESTEROL TOTAL: 187 mg/dL (ref 100–199)
Chol/HDL Ratio: 2.2 ratio (ref 0.0–4.4)
HDL: 86 mg/dL (ref >39)
LDL CALC: 89 mg/dL (ref 0–99)
TRIGLYCERIDES: 62 mg/dL (ref 0–149)
VLDL Cholesterol Cal: 12 mg/dL (ref 5–40)

## 2018-03-28 LAB — TSH: TSH: 4.31 u[IU]/mL (ref 0.450–4.500)

## 2018-03-28 NOTE — Telephone Encounter (Signed)
Pt advised and agrees with treatment plan. 

## 2018-03-28 NOTE — Telephone Encounter (Signed)
-----   Message from Erasmo DownerAngela M Bacigalupo, MD sent at 03/28/2018  9:10 AM EDT ----- Normal thyroid function, kidney function, electrolytes, liver function.  Blood sugar is slightly elevated if patient was fasting when these were taken.  Stable and normal cholesterol panel.  Recommend low-carb diet for hyperglycemia.  Erasmo DownerBacigalupo, Angela M, MD, MPH [facility] 03/28/2018 9:10 AM

## 2018-05-17 ENCOUNTER — Other Ambulatory Visit: Payer: Self-pay | Admitting: Gastroenterology

## 2018-07-17 ENCOUNTER — Ambulatory Visit: Payer: Self-pay | Admitting: Family Medicine

## 2018-07-18 ENCOUNTER — Encounter: Payer: Self-pay | Admitting: Family Medicine

## 2018-07-18 ENCOUNTER — Ambulatory Visit (INDEPENDENT_AMBULATORY_CARE_PROVIDER_SITE_OTHER): Payer: Medicare Other | Admitting: Family Medicine

## 2018-07-18 VITALS — BP 146/81 | HR 92 | Temp 98.5°F | Ht 66.0 in | Wt 177.0 lb

## 2018-07-18 DIAGNOSIS — Z23 Encounter for immunization: Secondary | ICD-10-CM

## 2018-07-18 DIAGNOSIS — Z0000 Encounter for general adult medical examination without abnormal findings: Secondary | ICD-10-CM | POA: Diagnosis not present

## 2018-07-18 NOTE — Patient Instructions (Addendum)
Due for colonoscopy.  We can order at next visit or sooner if you call.  You can also consider Cologuard   The CDC recommends two doses of Shingrix (the shingles vaccine) separated by 2 to 6 months for adults age 67 years and older. I recommend checking with your insurance plan regarding coverage for this vaccine.     Preventive Care 67 Years and Older, Female Preventive care refers to lifestyle choices and visits with your health care provider that can promote health and wellness. What does preventive care include?  A yearly physical exam. This is also called an annual well check.  Dental exams once or twice a year.  Routine eye exams. Ask your health care provider how often you should have your eyes checked.  Personal lifestyle choices, including: ? Daily care of your teeth and gums. ? Regular physical activity. ? Eating a healthy diet. ? Avoiding tobacco and drug use. ? Limiting alcohol use. ? Practicing safe sex. ? Taking low-dose aspirin every day. ? Taking vitamin and mineral supplements as recommended by your health care provider. What happens during an annual well check? The services and screenings done by your health care provider during your annual well check will depend on your age, overall health, lifestyle risk factors, and family history of disease. Counseling Your health care provider may ask you questions about your:  Alcohol use.  Tobacco use.  Drug use.  Emotional well-being.  Home and relationship well-being.  Sexual activity.  Eating habits.  History of falls.  Memory and ability to understand (cognition).  Work and work Statistician.  Reproductive health.  Screening You may have the following tests or measurements:  Height, weight, and BMI.  Blood pressure.  Lipid and cholesterol levels. These may be checked every 5 years, or more frequently if you are over 7 years old.  Skin check.  Lung cancer screening. You may have this  screening every year starting at age 67 if you have a 30-pack-year history of smoking and currently smoke or have quit within the past 15 years.  Fecal occult blood test (FOBT) of the stool. You may have this test every year starting at age 67.  Flexible sigmoidoscopy or colonoscopy. You may have a sigmoidoscopy every 5 years or a colonoscopy every 10 years starting at age 67.  Hepatitis C blood test.  Hepatitis B blood test.  Sexually transmitted disease (STD) testing.  Diabetes screening. This is done by checking your blood sugar (glucose) after you have not eaten for a while (fasting). You may have this done every 1-3 years.  Bone density scan. This is done to screen for osteoporosis. You may have this done starting at age 67.  Mammogram. This may be done every 1-2 years. Talk to your health care provider about how often you should have regular mammograms.  Talk with your health care provider about your test results, treatment options, and if necessary, the need for more tests. Vaccines Your health care provider may recommend certain vaccines, such as:  Influenza vaccine. This is recommended every year.  Tetanus, diphtheria, and acellular pertussis (Tdap, Td) vaccine. You may need a Td booster every 10 years.  Varicella vaccine. You may need this if you have not been vaccinated.  Zoster vaccine. You may need this after age 35.  Measles, mumps, and rubella (MMR) vaccine. You may need at least one dose of MMR if you were born in 1957 or later. You may also need a second dose.  Pneumococcal 13-valent  conjugate (PCV13) vaccine. One dose is recommended after age 60.  Pneumococcal polysaccharide (PPSV23) vaccine. One dose is recommended after age 58.  Meningococcal vaccine. You may need this if you have certain conditions.  Hepatitis A vaccine. You may need this if you have certain conditions or if you travel or work in places where you may be exposed to hepatitis A.  Hepatitis B  vaccine. You may need this if you have certain conditions or if you travel or work in places where you may be exposed to hepatitis B.  Haemophilus influenzae type b (Hib) vaccine. You may need this if you have certain conditions.  Talk to your health care provider about which screenings and vaccines you need and how often you need them. This information is not intended to replace advice given to you by your health care provider. Make sure you discuss any questions you have with your health care provider. Document Released: 10/02/2015 Document Revised: 05/25/2016 Document Reviewed: 07/07/2015 Elsevier Interactive Patient Education  [employer].

## 2018-07-18 NOTE — Progress Notes (Signed)
Patient: Angela Valenzuela, Female    DOB: 1951/07/13, 67 y.o.   MRN: [id number] Visit Date: 07/18/2018  Today's Provider: [name], MD   Chief Complaint  Patient presents with  . Medicare Wellness   Subjective:    Annual wellness visit Angela Valenzuela is a 67 y.o. female who presents today for her Subsequent Annual Wellness Visit. She feels well. She reports exercising lightly due to knee and foot injury. She reports she is sleeping well.  Last colonoscopy 04/22/2008-normal Last mammogram 12/27/2017-BI-RADS 1 Due for second pneumococcal vaccine, but wishes to wait 1 month and not get this today with her flu shot -----------------------------------------------------------   Review of Systems  Constitutional: Negative.   HENT: Negative.   Eyes: Negative.   Respiratory: Negative.   Cardiovascular: Negative.   Gastrointestinal: Negative.   Endocrine: Negative.   Genitourinary: Negative.   Musculoskeletal: Negative.   Skin: Negative.   Allergic/Immunologic: Negative.   Neurological: Negative.   Hematological: Negative.   Psychiatric/Behavioral: Negative.     Social History   Socioeconomic History  . Marital status: Married    Spouse name: Not on file  . Number of children: Not on file  . Years of education: Not on file  . Highest education level: Not on file  Occupational History  . Not on file  Social Needs  . Financial resource strain: Not on file  . Food insecurity:    Worry: Not on file    Inability: Not on file  . Transportation needs:    Medical: Not on file    Non-medical: Not on file  Tobacco Use  . Smoking status: Never Smoker  . Smokeless tobacco: Never Used  Substance and Sexual Activity  . Alcohol use: Yes    Alcohol/week: 0.0 standard drinks    Comment: occassional  . Drug use: No  . Sexual activity: Not on file  Lifestyle  . Physical activity:    Days per week: Not on file    Minutes per session: Not on file  . Stress: Not on file   Relationships  . Social connections:    Talks on phone: Not on file    Gets together: Not on file    Attends religious service: Not on file    Active member of club or organization: Not on file    Attends meetings of clubs or organizations: Not on file    Relationship status: Not on file  . Intimate partner violence:    Fear of current or ex partner: Not on file    Emotionally abused: Not on file    Physically abused: Not on file    Forced sexual activity: Not on file  Other Topics Concern  . Not on file  Social History Narrative  . Not on file    Patient Active Problem List   Diagnosis Date Noted  . Osteopenia of neck of left femur 10/18/2017  . Posterior tibial tendinitis of left leg 08/17/2017  . Peripheral neuropathy 07/14/2017  . Primary biliary cholangitis (HCC) 11/22/2016  . Scleroderma (HCC) 11/22/2016  . Hepatic cirrhosis due to primary biliary cholangitis (HCC) 07/01/2016  . Kidney cysts 06/24/2015  . Dermatitis, eczematoid 06/24/2015  . Dry mouth 06/24/2015  . Blood in the urine 06/24/2015  . 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 Subclinical hypothyroidism 06/24/2015  . Epidermoid carcinoma 06/24/2015  . Hypertension 04/30/2015  . Hyperlipidemia 04/30/2015  . Paroxysmal digital cyanosis 03/17/2015  . Raynaud's syndrome without gangrene 01/09/2008  . Allergic rhinitis 01/21/1999  History reviewed. No pertinent surgical history.  Her family history includes Alzheimer's disease in her mother; Hypertension in her father; Stroke in her father.     Previous Medications   AMLODIPINE (NORVASC) 5 MG TABLET       CALCIUM CARBONATE (TUMS EX) 750 MG CHEWABLE TABLET    Chew 1 tablet by mouth daily.   CALCIUM-VITAMIN D 600-200 MG-UNIT PER TABLET    Take by mouth.   IBUPROFEN (ADVIL) 200 MG TABLET    ADVIL, 200MG  (Oral Tablet)  1 Every Day, As Needed for 0 days  Quantity: 0.00;  Refills: 0   Ordered :11-Nov-2011  Amie Critchley ;  Started  01-Jan-2008 Active Comments: Medication taken as needed.    LORATADINE (CLARITIN) 10 MG TABLET    Take 1 tablet by mouth daily.   MELOXICAM (MOBIC) 7.5 MG TABLET    Take 7.5 mg by mouth daily.   PREVIDENT 5000 DRY MOUTH 1.1 % GEL DENTAL GEL    USE AS DIRECTED 1 TIME DAILY   RA KRILL OIL 500 MG CAPS    Take by mouth.   SIMVASTATIN (ZOCOR) 20 MG TABLET    TAKE 1 TABLET BY MOUTH EVERYDAY AT BEDTIME   URSODIOL (ACTIGALL) 500 MG TABLET    TAKE 1 TABLET (500 MG TOTAL) BY MOUTH 3 (THREE) TIMES DAILY.    Patient Care Team: Erasmo Downer, MD as PCP - General (Family Medicine)      Objective:   Vitals: BP (!) 146/81 (BP Location: Left Arm, Patient Position: Sitting, Cuff Size: Normal)   Pulse 92   Temp 98.5 F (36.9 C) (Oral)   Ht 5\' 6"  (1.676 m)   Wt 177 lb (80.3 kg)   SpO2 97%   BMI 28.57 kg/m   Physical Exam  Constitutional: She is oriented to person, place, and time. She appears well-developed and well-nourished. No distress.  HENT:  Head: Normocephalic and atraumatic.  Right Ear: External ear normal.  Left Ear: External ear normal.  Nose: Nose normal.  Mouth/Throat: Oropharynx is clear and moist.  Eyes: Pupils are equal, round, and reactive to light. Conjunctivae and EOM are normal. No scleral icterus.  Neck: Neck supple. No thyromegaly present.  Cardiovascular: Normal rate, regular rhythm, normal heart sounds and intact distal pulses.  No murmur heard. Pulmonary/Chest: Effort normal and breath sounds normal. No respiratory distress. She has no wheezes. She has no rales.  Abdominal: Soft. Bowel sounds are normal. She exhibits no distension. There is no tenderness. There is no rebound and no guarding.  Musculoskeletal: She exhibits no edema or deformity.  Lymphadenopathy:    She has no cervical adenopathy.  Neurological: She is alert and oriented to person, place, and time.  Skin: Skin is warm and dry. Capillary refill takes less than 2 seconds. No rash noted.   Psychiatric: She has a normal mood and affect. Her behavior is normal.  Vitals reviewed.   Activities of Daily Living In your present state of health, do you have any difficulty performing the following activities: 07/18/2018  Hearing? N  Vision? N  Difficulty concentrating or making decisions? N  Walking or climbing stairs? N  Dressing or bathing? N  Doing errands, shopping? N  Some recent data might be hidden    Fall Risk Assessment Fall Risk  07/18/2018 07/14/2017 07/01/2016 06/25/2015  Falls in the past year? No No No No     Depression Screen PHQ 2/9 Scores 07/18/2018 07/14/2017 07/01/2016 06/25/2015  PHQ - 2 Score 0 0 0  0  PHQ- 9 Score 0 0 - -    Cognitive Testing - 6-CIT  Correct? Score   What year is it? yes 0 0 or 4  What month is it? yes 0 0 or 3  Memorize:    Floyde Parkins,  42,  [address]  [place],      What time is it? (within 1 hour) yes 0 0 or 3  Count backwards from 20 yes 0 0, 2, or 4  Name the months of the year yes 0 0, 2, or 4  Repeat name & address above yes 1 0, 2, 4, 6, 8, or 10       TOTAL SCORE  1/28   Interpretation:  Normal  Normal (0-7) Abnormal (8-28)    Assessment & Plan:     Annual Wellness Visit  Reviewed patient's Family Medical History Reviewed and updated list of patient's medical providers Assessment of cognitive impairment was done Assessed patient's functional ability Established a written schedule for health screening services Health Risk Assessent Completed and Reviewed  Exercise Activities and Dietary recommendations Goals    . Exercise 150 minutes per week (moderate activity)       Immunization History  Administered Date(s) Administered  . Influenza Split 08/08/2012  . Influenza, High Dose Seasonal PF 07/01/2016, 07/14/2017, 07/18/2018  . Influenza,inj,Quad PF,6+ Mos 07/27/2013, 08/12/2014, 06/25/2015  . Pneumococcal Polysaccharide-23 07/14/2017  . Tdap 01/11/2008    Health Maintenance  Topic Date Due  .  COLONOSCOPY  04/22/2018  . PNA vac Low Risk Adult (2 of 2 - PCV13) 07/14/2018  . TETANUS/TDAP  07/19/2023 (Originally 01/10/2018)  . MAMMOGRAM  12/28/2019  . INFLUENZA VACCINE  Completed  . DEXA SCAN  Completed  . Hepatitis C Screening  Completed     Discussed health benefits of physical activity, and encouraged her to engage in regular exercise appropriate for her age and condition.    ------------------------------------------------------------------------------------------------------------  Patient due for colon cancer screening.  She will let me know if she wants to do Cologuard or colonoscopy in order will be placed at that time She will speak with her insurance about Shingrix She will follow-up in 1 month for her second pneumococcal vaccination   Return in about 6 months (around 01/17/2019) for chronic disease f/u.   The entirety of the information documented in the History of Present Illness, Review of Systems and Physical Exam were personally obtained by me. Portions of this information were initially documented by Presley Raddle and Dara Lords, CMA and reviewed by me for thoroughness and accuracy.    Erasmo Downer, MD, MPH [facility] 07/18/2018 11:16 AM

## 2018-08-24 ENCOUNTER — Ambulatory Visit: Payer: Medicare Other | Admitting: Family Medicine

## 2018-08-31 ENCOUNTER — Ambulatory Visit: Payer: Medicare Other | Admitting: Family Medicine

## 2018-10-05 ENCOUNTER — Ambulatory Visit: Payer: Medicare Other | Admitting: Family Medicine

## 2018-10-12 ENCOUNTER — Ambulatory Visit: Payer: Self-pay | Admitting: Family Medicine

## 2018-11-01 ENCOUNTER — Encounter: Payer: Self-pay | Admitting: Family Medicine

## 2018-11-01 ENCOUNTER — Ambulatory Visit (INDEPENDENT_AMBULATORY_CARE_PROVIDER_SITE_OTHER): Payer: Medicare Other | Admitting: Family Medicine

## 2018-11-01 DIAGNOSIS — Z23 Encounter for immunization: Secondary | ICD-10-CM | POA: Diagnosis not present

## 2018-11-01 NOTE — Progress Notes (Signed)
Patient here today for prevnar 13 vaccine. Patient denies any URI symptoms. Patient tolerated injection well. sd  I did not examine the patient.  I did review his medical history, medications, and allergies and vaccine consent form.  CMA gave vaccination. Patient tolerated well.  Erasmo Downer, MD, MPH [facility] 11/01/2018 11:09 AM

## 2019-01-15 NOTE — Progress Notes (Signed)
Patient: Angela Valenzuela Female    DOB: [birth year]   68 y.o.   MRN: [id number] Visit Date: 01/16/2019  Today's Provider: [name], MD   Chief Complaint  Patient presents with  . Hypertension  . Hyperlipidemia   Subjective:    Virtual Visit via Video Note  I connected with Angela Valenzuela on 01/16/19 at 10:40 AM EDT by a video enabled telemedicine application and verified that I am speaking with the correct person using two identifiers.   I discussed the limitations of evaluation and management by telemedicine and the availability of in person appointments. The patient expressed understanding and agreed to proceed.   Patient location: home Provider location: [facility] Persons involved in the visit: patient, provider    HPI  Hypertension, follow-up:     BP Readings from Last 3 Encounters:  03/26/18 (!) 142/70  11/06/17 (!) 142/82  10/18/17 132/60    She was last seen for hypertension 6 months ago BP at that visit was 142/70 Management since that visit includes continuing Amlodipine 5 mg. She reports good compliance with treatment. She is not having side effects.  She is exercising lightly due to foot and knee injury. She is adherent to low salt diet.   Outside blood pressures are not being checked at home She is experiencing none.  Patient denies chest pain, chest pressure/discomfort, claudication, dyspnea, exertional chest pressure/discomfort, fatigue, irregular heart beat, lower extremity edema, near-syncope, orthopnea, palpitations and syncope.   Cardiovascular risk factors include advanced age (older than 60 for men, 51 for women), dyslipidemia and hypertension.  Use of agents associated with hypertension: NSAIDS.                Weight trend: stable    Wt Readings from Last 3 Encounters:  03/26/18 179 lb (81.2 kg)  11/06/17 174 lb (78.9 kg)  10/18/17 175 lb (79.4 kg)   Current diet: in general, a "healthy" diet, but does  admit she eats canned foods and does not check labels for sodium content  ------------------------------------------------------------------------  Lipid/Cholesterol, Follow-up:   Last seen for this 6 months ago. Management changes since that visit include continue Simvastatin and Krill Oil.  Last Lipid Panel: Lab Results  Component Value Date   CHOL 187 03/27/2018   HDL 86 03/27/2018   LDLCALC 89 03/27/2018   TRIG 62 03/27/2018   CHOLHDL 2.2 03/27/2018    Risk factors for vascular disease include hypercholesterolemia and hypertension She reports good compliance with treatment.   No Known Allergies   Current Outpatient Medications:  .  amLODipine (NORVASC) 5 MG tablet, , Disp: , Rfl:  .  calcium carbonate (TUMS EX) 750 MG chewable tablet, Chew 1 tablet by mouth daily., Disp: , Rfl:  .  Calcium-Vitamin D 600-200 MG-UNIT per tablet, Take by mouth., Disp: , Rfl:  .  cycloSPORINE (RESTASIS) 0.05 % ophthalmic emulsion, 1 drop 2 (two) times daily., Disp: , Rfl:  .  ibuprofen (ADVIL) 200 MG tablet, ADVIL, 200MG  (Oral Tablet)  1 Every Day, As Needed for 0 days  Quantity: 0.00;  Refills: 0   Ordered :11-Nov-2011  Amie Critchley ;  Started 01-Jan-2008 Active Comments: Medication taken as needed. , Disp: , Rfl:  .  loratadine (CLARITIN) 10 MG tablet, Take 1 tablet by mouth daily., Disp: , Rfl:  .  meloxicam (MOBIC) 7.5 MG tablet, Take 7.5 mg by mouth daily., Disp: , Rfl: 3 .  PREVIDENT 5000 DRY MOUTH 1.1 % GEL dental  gel, USE AS DIRECTED 1 TIME DAILY, Disp: , Rfl: 3 .  RA KRILL OIL 500 MG CAPS, Take by mouth., Disp: , Rfl:  .  simvastatin (ZOCOR) 20 MG tablet, TAKE 1 TABLET BY MOUTH EVERYDAY AT BEDTIME, Disp: 90 tablet, Rfl: 3 .  ursodiol (ACTIGALL) 500 MG tablet, TAKE 1 TABLET (500 MG TOTAL) BY MOUTH 3 (THREE) TIMES DAILY., Disp: 270 tablet, Rfl: 3  Review of Systems  Constitutional: Negative.   Respiratory: Negative.   Cardiovascular: Negative.   Musculoskeletal: Negative.      Social History   Tobacco Use  . Smoking status: Never Smoker  . Smokeless tobacco: Never Used  Substance Use Topics  . Alcohol use: Yes    Alcohol/week: 0.0 standard drinks    Comment: occassional      Objective:   There were no vitals taken for this visit. There were no vitals filed for this visit.   Physical Exam Constitutional:      Appearance: Normal appearance.  Pulmonary:     Effort: Pulmonary effort is normal. No respiratory distress.  Neurological:     Mental Status: She is alert and oriented to person, place, and time. Mental status is at baseline.  Psychiatric:        Mood and Affect: Mood normal.        Behavior: Behavior normal.        Assessment & Plan      I discussed the assessment and treatment plan with the patient. The patient was provided an opportunity to ask questions and all were answered. The patient agreed with the plan and demonstrated an understanding of the instructions.   The patient was advised to call back or seek an in-person evaluation if the symptoms worsen or if the condition fails to improve as anticipated.   Problem List Items Addressed This Visit      Cardiovascular and Mediastinum   Hypertension - Primary    Uncontrolled Asymptomatic Discussed low sodium diet Increase amlodipine to 10mg  daily F/u in 1 month Next labs by 03/2019      Relevant Medications   amLODipine (NORVASC) 10 MG tablet     Other   Hyperlipidemia    Continue simvastatin Well controlled on last lipid panel Repeat in 03/2019      Relevant Medications   amLODipine (NORVASC) 10 MG tablet       Return in about 4 weeks (around 02/13/2019) for BP f/u.   The entirety of the information documented in the History of Present Illness, Review of Systems and Physical Exam were personally obtained by me. Portions of this information were initially documented by Presley RaddleNikki Walston, CMA and reviewed by me for thoroughness and accuracy.    Erasmo DownerBacigalupo, Lemoyne Nestor  M, MD, MPH [facility] 01/16/2019 12:41 PM

## 2019-01-16 ENCOUNTER — Encounter: Payer: Self-pay | Admitting: Family Medicine

## 2019-01-16 ENCOUNTER — Ambulatory Visit (INDEPENDENT_AMBULATORY_CARE_PROVIDER_SITE_OTHER): Payer: Medicare Other | Admitting: Family Medicine

## 2019-01-16 VITALS — BP 148/82

## 2019-01-16 DIAGNOSIS — I10 Essential (primary) hypertension: Secondary | ICD-10-CM | POA: Diagnosis not present

## 2019-01-16 DIAGNOSIS — E782 Mixed hyperlipidemia: Secondary | ICD-10-CM

## 2019-01-16 MED ORDER — AMLODIPINE BESYLATE 10 MG PO TABS: 10.0000 mg | ORAL_TABLET | Freq: Every day | ORAL | 3 refills | Status: DC

## 2019-01-16 NOTE — Assessment & Plan Note (Signed)
Uncontrolled Asymptomatic Discussed low sodium diet Increase amlodipine to 10mg  daily F/u in 1 month Next labs by 03/2019

## 2019-01-16 NOTE — Assessment & Plan Note (Signed)
Continue simvastatin Well controlled on last lipid panel Repeat in 03/2019

## 2019-01-16 NOTE — Patient Instructions (Addendum)
Increase amlodipine to 10mg  daily   No changes to other medications

## 2019-02-14 ENCOUNTER — Ambulatory Visit (INDEPENDENT_AMBULATORY_CARE_PROVIDER_SITE_OTHER): Payer: Medicare Other | Admitting: Family Medicine

## 2019-02-14 ENCOUNTER — Encounter: Payer: Self-pay | Admitting: Family Medicine

## 2019-02-14 VITALS — BP 123/62

## 2019-02-14 DIAGNOSIS — I10 Essential (primary) hypertension: Secondary | ICD-10-CM

## 2019-02-14 DIAGNOSIS — R609 Edema, unspecified: Secondary | ICD-10-CM | POA: Diagnosis not present

## 2019-02-14 MED ORDER — AMLODIPINE BESYLATE 5 MG PO TABS: 7.5000 mg | ORAL_TABLET | Freq: Every day | ORAL | 1 refills | Status: DC

## 2019-02-14 NOTE — Progress Notes (Signed)
Patient: Angela BlazerLinda M Dominik Female    DOB: June 15, 1951   68 y.o.   MRN: [id number] Visit Date: 02/14/2019  Today's Provider: Shirlee LatchAngela Myra Weng, MD   Chief Complaint  Patient presents with  . Hypertension   Subjective:    Virtual Visit via Video Note  I connected with Angela Valenzuela on 02/14/19 at 10:40 AM EDT by a video enabled telemedicine application and verified that I am speaking with the correct person using two identifiers.   Patient location: home [facility] Persons involved in the visit: patient, provider    I discussed the limitations of evaluation and management by telemedicine and the availability of in person appointments. The patient expressed understanding and agreed to proceed.  HPI  Hypertension, follow-up:  BP Readings from Last 3 Encounters:  02/14/19 123/62  01/16/19 (!) 148/82  07/18/18 (!) 146/81    She was last seen for hypertension 1 months ago.  BP at that visit was 148/82. Management changes since that visit include increase Amlodipine to 10 mg daily. She reports good compliance with treatment. She is not having side effects.  She is exercising lightly. She is adherent to low salt diet.   Outside blood pressures are being checked at home.  Home BPs are 104-125/65-80 She is experiencing none.  Patient denies chest pain, chest pressure/discomfort, claudication, dyspnea, exertional chest pressure/discomfort, fatigue, irregular heart beat, lower extremity edema, near-syncope, orthopnea, palpitations, paroxysmal nocturnal dyspnea, syncope and tachypnea.   Cardiovascular risk factors include advanced age (older than 2855 for men, 1265 for women), dyslipidemia and hypertension.  Use of agents associated with hypertension: NSAIDS.     Weight trend: stable Wt Readings from Last 3 Encounters:  07/18/18 177 lb (80.3 kg)  03/26/18 179 lb (81.2 kg)  11/06/17 174 lb (78.9 kg)     ------------------------------------------------------------------------  No Known Allergies   Current Outpatient Medications:  .  amLODipine (NORVASC) 10 MG tablet, Take 1 tablet (10 mg total) by mouth daily., Disp: 30 tablet, Rfl: 3 .  calcium carbonate (TUMS EX) 750 MG chewable tablet, Chew 1 tablet by mouth daily., Disp: , Rfl:  .  Calcium-Vitamin D 600-200 MG-UNIT per tablet, Take by mouth., Disp: , Rfl:  .  cycloSPORINE (RESTASIS) 0.05 % ophthalmic emulsion, 1 drop 2 (two) times daily., Disp: , Rfl:  .  ibuprofen (ADVIL) 200 MG tablet, ADVIL, 200MG  (Oral Tablet)  1 Every Day, As Needed for 0 days  Quantity: 0.00;  Refills: 0   Ordered :11-Nov-2011  Amie CritchleyMitchell, Stephanie ;  Started 01-Jan-2008 Active Comments: Medication taken as needed. , Disp: , Rfl:  .  loratadine (CLARITIN) 10 MG tablet, Take 1 tablet by mouth daily., Disp: , Rfl:  .  meloxicam (MOBIC) 7.5 MG tablet, Take 7.5 mg by mouth daily., Disp: , Rfl: 3 .  PREVIDENT 5000 DRY MOUTH 1.1 % GEL dental gel, USE AS DIRECTED 1 TIME DAILY, Disp: , Rfl: 3 .  RA KRILL OIL 500 MG CAPS, Take by mouth., Disp: , Rfl:  .  simvastatin (ZOCOR) 20 MG tablet, TAKE 1 TABLET BY MOUTH EVERYDAY AT BEDTIME, Disp: 90 tablet, Rfl: 3 .  ursodiol (ACTIGALL) 500 MG tablet, TAKE 1 TABLET (500 MG TOTAL) BY MOUTH 3 (THREE) TIMES DAILY., Disp: 270 tablet, Rfl: 3  Review of Systems  Constitutional: Negative.   Respiratory: Negative.   Cardiovascular: Negative.   Musculoskeletal: Negative.     Social History   Tobacco Use  . Smoking status: Never Smoker  .  Smokeless tobacco: Never Used  Substance Use Topics  . Alcohol use: Yes    Alcohol/week: 0.0 standard drinks    Comment: occassional      Objective:   BP 123/62 (BP Location: Right Arm, Patient Position: Sitting, Cuff Size: Normal)  Vitals:   02/14/19 0900  BP: 123/62     Physical Exam Constitutional:      Appearance: Normal appearance.  Pulmonary:     Effort: Pulmonary effort is  normal. No respiratory distress.  Neurological:     Mental Status: She is alert and oriented to person, place, and time.  Psychiatric:        Mood and Affect: Mood normal.        Behavior: Behavior normal.         Assessment & Plan    I discussed the assessment and treatment plan with the patient. The patient was provided an opportunity to ask questions and all were answered. The patient agreed with the plan and demonstrated an understanding of the instructions.   The patient was advised to call back or seek an in-person evaluation if the symptoms worsen or if the condition fails to improve as anticipated.  Problem List Items Addressed This Visit      Cardiovascular and Mediastinum   Hypertension - Primary    Normotensive to slightly hypotensive Asymptomatic Given that she is experiencing LE edema likely as medication effect, will decrease dose of amlodipine to 7.5 mg daily Her blood pressure is now well controlled on 5 mg daily and now she is slightly hypotensive and having lower extremity edema on 10 mg a day, so we will try to shoot the middle Advised on return precautions Follow-up in 1 month to reassess blood pressure control      Relevant Medications   amLODipine (NORVASC) 5 MG tablet    Other Visit Diagnoses    Peripheral edema        -New problem - Low-sodium diet - Likely a result of increasing the amlodipine dose -As above, we will try a lower amlodipine dose to see if this helps -Also discussed possibility of using a thiazide diuretic instead of a calcium channel blocker, but she is also taking calcium channel blocker for her Reynauds   Return in about 4 weeks (around 03/14/2019) for BP f/u.   The entirety of the information documented in the History of Present Illness, Review of Systems and Physical Exam were personally obtained by me. Portions of this information were initially documented by Presley Raddle, CMA and reviewed by me for thoroughness and accuracy.     Izaias Krupka, Marzella Schlein, MD MPH [facility]

## 2019-02-14 NOTE — Assessment & Plan Note (Signed)
Normotensive to slightly hypotensive Asymptomatic Given that she is experiencing LE edema likely as medication effect, will decrease dose of amlodipine to 7.5 mg daily Her blood pressure is now well controlled on 5 mg daily and now she is slightly hypotensive and having lower extremity edema on 10 mg a day, so we will try to shoot the middle Advised on return precautions Follow-up in 1 month to reassess blood pressure control

## 2019-03-10 ENCOUNTER — Other Ambulatory Visit: Payer: Self-pay | Admitting: Family Medicine

## 2019-03-11 NOTE — Telephone Encounter (Signed)
L.O.V. 02/14/2019 

## 2019-03-21 ENCOUNTER — Encounter: Payer: Self-pay | Admitting: Family Medicine

## 2019-03-21 ENCOUNTER — Other Ambulatory Visit: Payer: Self-pay | Admitting: Family Medicine

## 2019-03-21 ENCOUNTER — Ambulatory Visit (INDEPENDENT_AMBULATORY_CARE_PROVIDER_SITE_OTHER): Payer: Medicare Other | Admitting: Family Medicine

## 2019-03-21 VITALS — BP 130/71 | HR 89 | Wt 175.0 lb

## 2019-03-21 DIAGNOSIS — I10 Essential (primary) hypertension: Secondary | ICD-10-CM | POA: Diagnosis not present

## 2019-03-21 NOTE — Progress Notes (Signed)
Patient: Angela Valenzuela Female    DOB: Oct 29, 1950   68 y.o.   MRN: [id number] Visit Date: 03/21/2019  Today's Provider: [name], MD   Chief Complaint  Patient presents with  . Hypertension   Subjective:    I, Porsha McClurkin CMA, am acting as a scribe for [name], MD.    Virtual Visit via Video Note  I connected with Angela Valenzuela on 03/21/19 at 10:40 AM EDT by a video enabled telemedicine application and verified that I am speaking with the correct person using two identifiers.  Patient location: home Provider location: [place] involved in the visit: patient, provider   I discussed the limitations of evaluation and management by telemedicine and the availability of in person appointments. The patient expressed understanding and agreed to proceed.  HPI  Hypertension, follow-up:     BP Readings from Last 3 Encounters:  02/14/19 123/62  01/16/19 (!) 148/82  07/18/18 (!) 146/81    She was last seen for hypertension 5 weeks ago.  BP at that visit was 123/62. Management changes since that visit include decrease Amlodipine to 7.5 mg daily due to LE edema. She reports good compliance with treatment. She is not having side effects.  She is exercising lightly. She is adherent to low salt diet.   Outside blood pressures readings: 137/70,136/81 in last week, was in 110s/60s prior to this - she has had some stressors in last week. She is experiencing none.  Patient denies chest pain, chest pressure/discomfort, claudication, dyspnea, exertional chest pressure/discomfort, fatigue, irregular heart beat, lower extremity edema, near-syncope, orthopnea, palpitations, paroxysmal nocturnal dyspnea, syncope and tachypnea.   Cardiovascular risk factors include advanced age (older than 43 for men, 87 for women), dyslipidemia and hypertension.  Use of agents associated with hypertension: NSAIDS.                Weight trend: stable  Wt Readings from Last 3 Encounters:  07/18/18 177 lb (80.3 kg)  03/26/18 179 lb (81.2 kg)  11/06/17 174 lb (78.9 kg)    ------------------------------------------------------------------------  No Known Allergies   Current Outpatient Medications:  .  amLODipine (NORVASC) 5 MG tablet, TAKE 1.5 TABLETS (7.5 MG TOTAL) BY MOUTH DAILY., Disp: 45 tablet, Rfl: 1 .  calcium carbonate (TUMS EX) 750 MG chewable tablet, Chew 1 tablet by mouth daily., Disp: , Rfl:  .  Calcium-Vitamin D 600-200 MG-UNIT per tablet, Take by mouth., Disp: , Rfl:  .  cycloSPORINE (RESTASIS) 0.05 % ophthalmic emulsion, 1 drop 2 (two) times daily., Disp: , Rfl:  .  ibuprofen (ADVIL) 200 MG tablet, ADVIL, 200MG  (Oral Tablet)  1 Every Day, As Needed for 0 days  Quantity: 0.00;  Refills: 0   Ordered :11-Nov-2011  Edmonia James ;  Started 01-Jan-2008 Active Comments: Medication taken as needed. , Disp: , Rfl:  .  loratadine (CLARITIN) 10 MG tablet, Take 1 tablet by mouth daily., Disp: , Rfl:  .  meloxicam (MOBIC) 7.5 MG tablet, Take 7.5 mg by mouth daily., Disp: , Rfl: 3 .  PREVIDENT 5000 DRY MOUTH 1.1 % GEL dental gel, USE AS DIRECTED 1 TIME DAILY, Disp: , Rfl: 3 .  RA KRILL OIL 500 MG CAPS, Take by mouth., Disp: , Rfl:  .  simvastatin (ZOCOR) 20 MG tablet, TAKE 1 TABLET BY MOUTH EVERYDAY AT BEDTIME, Disp: 90 tablet, Rfl: 3 .  ursodiol (ACTIGALL) 500 MG tablet, TAKE 1 TABLET (500 MG TOTAL) BY MOUTH 3 (THREE)  TIMES DAILY., Disp: 270 tablet, Rfl: 3  Review of Systems  Constitutional: Negative.   Respiratory: Negative.   Cardiovascular: Negative.   Musculoskeletal: Negative.   Neurological: Negative.     Social History   Tobacco Use  . Smoking status: Never Smoker  . Smokeless tobacco: Never Used  Substance Use Topics  . Alcohol use: Yes    Alcohol/week: 0.0 standard drinks    Comment: occassional      Objective:   BP 130/71 (BP Location: Right Arm, Patient Position: Sitting, Cuff Size: Normal)   Pulse  89   Wt 175 lb (79.4 kg)   BMI 28.25 kg/m  Vitals:   03/21/19 0945  BP: 130/71  Pulse: 89  Weight: 175 lb (79.4 kg)     Physical Exam Constitutional:      Appearance: Normal appearance.  HENT:     Head: Normocephalic and atraumatic.  Pulmonary:     Effort: Pulmonary effort is normal. No respiratory distress.  Neurological:     Mental Status: She is alert and oriented to person, place, and time. Mental status is at baseline.  Psychiatric:        Mood and Affect: Mood normal.        Behavior: Behavior normal.     No results found for any visits on 03/21/19.     Assessment & Plan    I discussed the assessment and treatment plan with the patient. The patient was provided an opportunity to ask questions and all were answered. The patient agreed with the plan and demonstrated an understanding of the instructions.   The patient was advised to call back or seek an in-person evaluation if the symptoms worsen or if the condition fails to improve as anticipated.  Problem List Items Addressed This Visit      Cardiovascular and Mediastinum   Hypertension - Primary    Well controlled Continue amlodipine 7.5mg  daily Continue home monitoring F/u at CPE as scheduled Repeat metabolic panel at next visit          Return in about 4 months (around 07/22/2019) for CPE/AWV as scheduled.   The entirety of the information documented in the History of Present Illness, Review of Systems and Physical Exam were personally obtained by me. Portions of this information were initially documented by Presley RaddleNikki Walston, CMA and reviewed by me for thoroughness and accuracy.    , Marzella SchleinAngela M, MD MPH [facility]

## 2019-03-21 NOTE — Assessment & Plan Note (Signed)
Well controlled Continue amlodipine 7.5mg  daily Continue home monitoring F/u at CPE as scheduled Repeat metabolic panel at next visit

## 2019-04-12 ENCOUNTER — Other Ambulatory Visit: Payer: Self-pay | Admitting: Family Medicine

## 2019-05-12 ENCOUNTER — Other Ambulatory Visit: Payer: Self-pay | Admitting: Family Medicine

## 2019-06-01 ENCOUNTER — Other Ambulatory Visit: Payer: Self-pay | Admitting: Gastroenterology

## 2019-06-11 ENCOUNTER — Other Ambulatory Visit: Payer: Self-pay | Admitting: Family Medicine

## 2019-06-11 NOTE — Telephone Encounter (Signed)
L.O.V. was on 03/21/2019.

## 2019-07-05 ENCOUNTER — Other Ambulatory Visit: Payer: Self-pay | Admitting: Family Medicine

## 2019-07-07 LAB — FECAL OCCULT BLOOD, IMMUNOCHEMICAL: IFOBT: NEGATIVE

## 2019-07-23 ENCOUNTER — Encounter: Payer: Medicare Other | Admitting: Family Medicine

## 2019-07-23 NOTE — Progress Notes (Addendum)
Subjective:   Angela Valenzuela is a 68 y.o. female who presents for Medicare Annual (Subsequent) preventive examination.  Review of Systems:  N/A  Cardiac Risk Factors include: advanced age (>65men, >2 women);dyslipidemia;hypertension     Objective:     Vitals: BP (!) 160/82 (BP Location: Left Arm)   Pulse 98   Temp 98.7 F (37.1 C) (Oral)   Ht 5\' 6"  (1.676 m)   Wt 177 lb 12.8 oz (80.6 kg)   BMI 28.70 kg/m   Body mass index is 28.7 kg/m.  Advanced Directives 07/24/2019 06/25/2015  Does Patient Have a Medical Advance Directive? Yes Yes  Type of 08/25/2015 of [place];Living will Healthcare Power of [place];Living will  Copy of Healthcare Power of Attorney in Chart? No - copy requested No - copy requested    Tobacco Social History   Tobacco Use  Smoking Status Never Smoker  Smokeless Tobacco Never Used     Counseling given: Not Answered   Clinical Intake:  Pre-visit preparation completed: Yes  Pain : No/denies pain Pain Score: 0-No pain     Nutritional Status: BMI 25 -29 Overweight Nutritional Risks: None Diabetes: No  How often do you need to have someone help you when you read instructions, pamphlets, or other written materials from your doctor or pharmacy?: 1 - Never  Interpreter Needed?: No  Information entered by :: [facility], LPN  Past Medical History:  Diagnosis Date  . Allergy   . Hyperlipidemia   . Hypertension   . Increased liver enzymes 03/17/2015  . Paroxysmal digital cyanosis 03/17/2015   Overview:   a.  Positive anticentromere antibody b.   Sicca-xerostomia c.  RVSP 41 in 2013   . Raynaud's disease 04/30/2015  . Thyroid disease    History reviewed. No pertinent surgical history. Family History  Problem Relation Age of Onset  . Hypertension Father   . Stroke Father   . Alzheimer's disease Mother    Social History   Socioeconomic History  . Marital status: Married    Spouse name: Not on file  . Number of  children: 1  . Years of education: Not on file  . Highest education level: Some college, no degree  Occupational History  . Occupation: retired  06/30/2015  . Financial resource strain: Not hard at all  . Food insecurity    Worry: Never true    Inability: Never true  . Transportation needs    Medical: No    Non-medical: No  Tobacco Use  . Smoking status: Never Smoker  . Smokeless tobacco: Never Used  Substance and Sexual Activity  . Alcohol use: Not Currently    Alcohol/week: 0.0 standard drinks  . Drug use: No  . Sexual activity: Not on file  Lifestyle  . Physical activity    Days per week: 0 days    Minutes per session: 0 min  . Stress: Not at all  Relationships  . Social Engineer, production on phone: Patient refused    Gets together: Patient refused    Attends religious service: Patient refused    Active member of club or organization: Patient refused    Attends meetings of clubs or organizations: Patient refused    Relationship status: Patient refused  Other Topics Concern  . Not on file  Social History Narrative  . Not on file    Outpatient Encounter Medications as of 07/24/2019  Medication Sig  . amLODipine (NORVASC) 5 MG tablet TAKE 1.5 TABLETS (  7.5 MG TOTAL) BY MOUTH DAILY.  . calcium carbonate (TUMS EX) 750 MG chewable tablet Chew 1 tablet by mouth daily.  . Calcium-Vitamin D 600-200 MG-UNIT per tablet Take by mouth.  Marland Kitchen. ibuprofen (ADVIL) 200 MG tablet ADVIL, 200MG  (Oral Tablet)  1 Every Day, As Needed for 0 days  Quantity: 0.00;  Refills: 0   Ordered :11-Nov-2011  Amie CritchleyMitchell, Stephanie ;  Started 01-Jan-2008 Active Comments: Medication taken as needed.   . loratadine (CLARITIN) 10 MG tablet Take 1 tablet by mouth daily.  . meloxicam (MOBIC) 7.5 MG tablet Take 7.5 mg by mouth daily.  Marland Kitchen. PREVIDENT 5000 DRY MOUTH 1.1 % GEL dental gel USE AS DIRECTED 1 TIME DAILY  . RA KRILL OIL 500 MG CAPS Take by mouth.  . simvastatin (ZOCOR) 20 MG tablet TAKE 1 TABLET BY  MOUTH EVERYDAY AT BEDTIME  . ursodiol (ACTIGALL) 500 MG tablet Take 1 tablet (500 mg total) by mouth 3 (three) times daily. **PLEASE SCHEDULE FOLLOW UP APPT**  . cycloSPORINE (RESTASIS) 0.05 % ophthalmic emulsion 1 drop 2 (two) times daily.   No facility-administered encounter medications on file as of 07/24/2019.     Activities of Daily Living In your present state of health, do you have any difficulty performing the following activities: 07/24/2019  Hearing? Y  Comment Does not wear hearing aids.  Vision? N  Difficulty concentrating or making decisions? N  Walking or climbing stairs? N  Dressing or bathing? N  Doing errands, shopping? N  Preparing Food and eating ? N  Using the Toilet? N  In the past six months, have you accidently leaked urine? N  Do you have problems with loss of bowel control? N  Managing your Medications? N  Managing your Finances? N  Housekeeping or managing your Housekeeping? N  Some recent data might be hidden    Patient Care Team: Erasmo DownerBacigalupo, Angela M, MD as PCP - General (Family Medicine) Kandyce RudKernodle, George W Jr., MD (Rheumatology) Midge MiniumWohl, Darren, MD as Consulting Physician (Gastroenterology)    Assessment:   This is a routine wellness examination for Angela Valenzuela.  Exercise Activities and Dietary recommendations Current Exercise Habits: The patient does not participate in regular exercise at present, Exercise limited by: orthopedic condition(s)  Goals    . DIET - REDUCE SUGAR INTAKE     Recommend to cut back on sugar in daily diet to help aid in weight loss.    . Exercise 150 minutes per week (moderate activity)       Fall Risk: Fall Risk  07/24/2019 07/24/2019 01/16/2019 07/18/2018 07/14/2017  Falls in the past year? 0 0 0 No No  Number falls in past yr: 0 0 - - -  Injury with Fall? 0 0 - - -    FALL RISK PREVENTION PERTAINING TO THE HOME:  Any stairs in or around the home? Yes  If so, are there any without handrails? No   Home free of loose  throw rugs in walkways, pet beds, electrical cords, etc? Yes  Adequate lighting in your home to reduce risk of falls? Yes   ASSISTIVE DEVICES UTILIZED TO PREVENT FALLS:  Life alert? No  Use of a cane, walker or w/c? No  Grab bars in the bathroom? No  Shower chair or bench in shower? No  Elevated toilet seat or a handicapped toilet? No    TIMED UP AND GO:  Was the test performed? No .    Depression Screen PHQ 2/9 Scores 07/24/2019 07/18/2018 07/14/2017 07/01/2016  PHQ -  2 Score 0 0 0 0  PHQ- 9 Score - 0 0 -     Cognitive Function: Declined today.      6CIT Screen 07/14/2017  What Year? 0 points  What month? 0 points  What time? 0 points  Count back from 20 0 points  Months in reverse 0 points  Repeat phrase 0 points  Total Score 0    Immunization History  Administered Date(s) Administered  . Influenza Split 08/08/2012  . Influenza, High Dose Seasonal PF 07/01/2016, 07/14/2017, 07/18/2018  . Influenza,inj,Quad PF,6+ Mos 07/27/2013, 08/12/2014, 06/25/2015  . Pneumococcal Conjugate-13 11/01/2018  . Pneumococcal Polysaccharide-23 07/14/2017  . Tdap 01/11/2008    Qualifies for Shingles Vaccine? Yes . Due for Shingrix. Education has been provided regarding the importance of this vaccine. Pt has been advised to call insurance company to determine out of pocket expense. Advised may also receive vaccine at local pharmacy or Health Dept. Verbalized acceptance and understanding.  Tdap: Although this vaccine is not a covered service during a Wellness Exam, does the patient still wish to receive this vaccine today?  No .   Flu Vaccine: Due for Flu vaccine. Does the patient want to receive this vaccine today?  Yes .   Pneumococcal Vaccine: Completed series  Screening Tests Health Maintenance  Topic Date Due  . TETANUS/TDAP  07/19/2023 (Originally 01/10/2018)  . MAMMOGRAM  12/28/2019  . Fecal DNA (Cologuard)  06/12/2022  . DEXA SCAN  10/10/2022  . INFLUENZA VACCINE   Completed  . Hepatitis C Screening  Completed  . PNA vac Low Risk Adult  Completed    Cancer Screenings:  Colorectal Screening: Cologuard completed 06/13/19. Repeat every 3 years.   Mammogram: Completed 12/27/17.   Bone Density: Completed 10/10/17. Results reflect OSTEOPENIA. Repeat every 5 years.   Lung Cancer Screening: (Low Dose CT Chest recommended if Age 42-80 years, 30 pack-year currently smoking OR have quit w/in 15years.) does not qualify.   Additional Screening:  Hepatitis C Screening: Up to date  Vision Screening: Recommended annual ophthalmology exams for early detection of glaucoma and other disorders of the eye.  Dental Screening: Recommended annual dental exams for proper oral hygiene  Community Resource Referral:  CRR required this visit?  No       Plan:  I have personally reviewed and addressed the Medicare Annual Wellness questionnaire and have noted the following in the patient's chart:  A. Medical and social history B. Use of alcohol, tobacco or illicit drugs  C. Current medications and supplements D. Functional ability and status E.  Nutritional status F.  Physical activity G. Advance directives H. List of other physicians I.  Hospitalizations, surgeries, and ER visits in previous 12 months J.  [place] such as hearing and vision if needed, cognitive and depression L. Referrals and appointments   In addition, I have reviewed and discussed with patient certain preventive protocols, quality metrics, and best practice recommendations. A written personalized care plan for preventive services as well as general preventive health recommendations were provided to patient. Nurse Health Advisor  Signed,    Diera Wirkkala [place], [place]  67/10/945 Nurse Health Advisor   Nurse Notes: None.

## 2019-07-24 ENCOUNTER — Encounter: Payer: Self-pay | Admitting: Family Medicine

## 2019-07-24 ENCOUNTER — Other Ambulatory Visit: Payer: Self-pay

## 2019-07-24 ENCOUNTER — Ambulatory Visit (INDEPENDENT_AMBULATORY_CARE_PROVIDER_SITE_OTHER): Payer: Medicare Other

## 2019-07-24 ENCOUNTER — Ambulatory Visit (INDEPENDENT_AMBULATORY_CARE_PROVIDER_SITE_OTHER): Payer: Medicare Other | Admitting: Family Medicine

## 2019-07-24 VITALS — BP 140/78 | HR 91 | Temp 98.7°F | Resp 16 | Ht 66.0 in | Wt 177.0 lb

## 2019-07-24 VITALS — BP 160/82 | HR 98 | Temp 98.7°F | Ht 66.0 in | Wt 177.8 lb

## 2019-07-24 DIAGNOSIS — Z0000 Encounter for general adult medical examination without abnormal findings: Secondary | ICD-10-CM

## 2019-07-24 DIAGNOSIS — Z23 Encounter for immunization: Secondary | ICD-10-CM | POA: Diagnosis not present

## 2019-07-24 DIAGNOSIS — E039 Hypothyroidism, unspecified: Secondary | ICD-10-CM

## 2019-07-24 DIAGNOSIS — I10 Essential (primary) hypertension: Secondary | ICD-10-CM

## 2019-07-24 DIAGNOSIS — G6289 Other specified polyneuropathies: Secondary | ICD-10-CM

## 2019-07-24 DIAGNOSIS — M349 Systemic sclerosis, unspecified: Secondary | ICD-10-CM

## 2019-07-24 DIAGNOSIS — E038 Other specified hypothyroidism: Secondary | ICD-10-CM

## 2019-07-24 DIAGNOSIS — E782 Mixed hyperlipidemia: Secondary | ICD-10-CM | POA: Diagnosis not present

## 2019-07-24 DIAGNOSIS — K743 Primary biliary cirrhosis: Secondary | ICD-10-CM

## 2019-07-24 NOTE — Patient Instructions (Signed)
Angela Valenzuela , Thank you for taking time to come for your Medicare Wellness Visit. I appreciate your ongoing commitment to your health goals. Please review the following plan we discussed and let me know if I can assist you in the future.   Screening recommendations/referrals: Colonoscopy: Cologuard up to date, due 06/12/2022. Mammogram: Up to date, due 12/2019 Bone Density: Up to date, due 09/2022 Recommended yearly ophthalmology/optometry visit for glaucoma screening and checkup Recommended yearly dental visit for hygiene and checkup  Vaccinations: Influenza vaccine: Administered today.  Pneumococcal vaccine: Completed series Tdap vaccine: Pt declines today.  Shingles vaccine: Pt declines today.     Advanced directives: Please bring a copy of your POA (Power of Attorney) and/or Living Will to your next appointment.   Conditions/risks identified: Recommend to cut back on sugar in daily diet to help aid in weight loss.  Next appointment: 10:00 AM with Dr Brita Romp.    Preventive Care 34 Years and Older, Female Preventive care refers to lifestyle choices and visits with your health care provider that can promote health and wellness. What does preventive care include?  A yearly physical exam. This is also called an annual well check.  Dental exams once or twice a year.  Routine eye exams. Ask your health care provider how often you should have your eyes checked.  Personal lifestyle choices, including:  Daily care of your teeth and gums.  Regular physical activity.  Eating a healthy diet.  Avoiding tobacco and drug use.  Limiting alcohol use.  Practicing safe sex.  Taking low-dose aspirin every day.  Taking vitamin and mineral supplements as recommended by your health care provider. What happens during an annual well check? The services and screenings done by your health care provider during your annual well check will depend on your age, overall health, lifestyle risk  factors, and family history of disease. Counseling  Your health care provider may ask you questions about your:  Alcohol use.  Tobacco use.  Drug use.  Emotional well-being.  Home and relationship well-being.  Sexual activity.  Eating habits.  History of falls.  Memory and ability to understand (cognition).  Work and work Statistician.  Reproductive health. Screening  You may have the following tests or measurements:  Height, weight, and BMI.  Blood pressure.  Lipid and cholesterol levels. These may be checked every 5 years, or more frequently if you are over 37 years old.  Skin check.  Lung cancer screening. You may have this screening every year starting at age 75 if you have a 30-pack-year history of smoking and currently smoke or have quit within the past 15 years.  Fecal occult blood test (FOBT) of the stool. You may have this test every year starting at age 43.  Flexible sigmoidoscopy or colonoscopy. You may have a sigmoidoscopy every 5 years or a colonoscopy every 10 years starting at age 91.  Hepatitis C blood test.  Hepatitis B blood test.  Sexually transmitted disease (STD) testing.  Diabetes screening. This is done by checking your blood sugar (glucose) after you have not eaten for a while (fasting). You may have this done every 1-3 years.  Bone density scan. This is done to screen for osteoporosis. You may have this done starting at age 24.  Mammogram. This may be done every 1-2 years. Talk to your health care provider about how often you should have regular mammograms. Talk with your health care provider about your test results, treatment options, and if necessary, the need  for more tests. Vaccines  Your health care provider may recommend certain vaccines, such as:  Influenza vaccine. This is recommended every year.  Tetanus, diphtheria, and acellular pertussis (Tdap, Td) vaccine. You may need a Td booster every 10 years.  Zoster vaccine. You  may need this after age 28.  Pneumococcal 13-valent conjugate (PCV13) vaccine. One dose is recommended after age 93.  Pneumococcal polysaccharide (PPSV23) vaccine. One dose is recommended after age 83. Talk to your health care provider about which screenings and vaccines you need and how often you need them. This information is not intended to replace advice given to you by your health care provider. Make sure you discuss any questions you have with your health care provider. Document Released: 10/02/2015 Document Revised: 05/25/2016 Document Reviewed: 07/07/2015 Elsevier Interactive Patient Education  2017 [place] Prevention in the Home Falls can cause injuries. They can happen to people of all ages. There are many things you can do to make your home safe and to help prevent falls. What can I do on the outside of my home?  Regularly fix the edges of walkways and driveways and fix any cracks.  Remove anything that might make you trip as you walk through a door, such as a raised step or threshold.  Trim any bushes or trees on the path to your home.  Use bright outdoor lighting.  Clear any walking paths of anything that might make someone trip, such as rocks or tools.  Regularly check to see if handrails are loose or broken. Make sure that both sides of any steps have handrails.  Any raised decks and porches should have guardrails on the edges.  Have any leaves, snow, or ice cleared regularly.  Use sand or salt on walking paths during winter.  Clean up any spills in your garage right away. This includes oil or grease spills. What can I do in the bathroom?  Use night lights.  Install grab bars by the toilet and in the tub and shower. Do not use towel bars as grab bars.  Use non-skid mats or decals in the tub or shower.  If you need to sit down in the shower, use a plastic, non-slip stool.  Keep the floor dry. Clean up any water that spills on the floor as soon as  it happens.  Remove soap buildup in the tub or shower regularly.  Attach bath mats securely with double-sided non-slip rug tape.  Do not have throw rugs and other things on the floor that can make you trip. What can I do in the bedroom?  Use night lights.  Make sure that you have a light by your bed that is easy to reach.  Do not use any sheets or blankets that are too big for your bed. They should not hang down onto the floor.  Have a firm chair that has side arms. You can use this for support while you get dressed.  Do not have throw rugs and other things on the floor that can make you trip. What can I do in the kitchen?  Clean up any spills right away.  Avoid walking on wet floors.  Keep items that you use a lot in easy-to-reach places.  If you need to reach something above you, use a strong step stool that has a grab bar.  Keep electrical cords out of the way.  Do not use floor polish or wax that makes floors slippery. If you must use wax, use  non-skid floor wax.  Do not have throw rugs and other things on the floor that can make you trip. What can I do with my stairs?  Do not leave any items on the stairs.  Make sure that there are handrails on both sides of the stairs and use them. Fix handrails that are broken or loose. Make sure that handrails are as long as the stairways.  Check any carpeting to make sure that it is firmly attached to the stairs. Fix any carpet that is loose or worn.  Avoid having throw rugs at the top or bottom of the stairs. If you do have throw rugs, attach them to the floor with carpet tape.  Make sure that you have a light switch at the top of the stairs and the bottom of the stairs. If you do not have them, ask someone to add them for you. What else can I do to help prevent falls?  Wear shoes that:  Do not have high heels.  Have rubber bottoms.  Are comfortable and fit you well.  Are closed at the toe. Do not wear sandals.  If  you use a stepladder:  Make sure that it is fully opened. Do not climb a closed stepladder.  Make sure that both sides of the stepladder are locked into place.  Ask someone to hold it for you, if possible.  Clearly mark and make sure that you can see:  Any grab bars or handrails.  First and last steps.  Where the edge of each step is.  Use tools that help you move around (mobility aids) if they are needed. These include:  Canes.  Walkers.  Scooters.  Crutches.  Turn on the lights when you go into a dark area. Replace any light bulbs as soon as they burn out.  Set up your furniture so you have a clear path. Avoid moving your furniture around.  If any of your floors are uneven, fix them.  If there are any pets around you, be aware of where they are.  Review your medicines with your doctor. Some medicines can make you feel dizzy. This can increase your chance of falling. Ask your doctor what other things that you can do to help prevent falls. This information is not intended to replace advice given to you by your health care provider. Make sure you discuss any questions you have with your health care provider. Document Released: 07/02/2009 Document Revised: 02/11/2016 Document Reviewed: 10/10/2014 Elsevier Interactive Patient Education  2017 [employer].

## 2019-07-24 NOTE — Assessment & Plan Note (Signed)
Followed by Rheumatology regularly

## 2019-07-24 NOTE — Assessment & Plan Note (Signed)
Ongoing and mild Previous B12 level normal Suspect mechanical issue due to loss of transverse arch No additional workup necessary

## 2019-07-24 NOTE — Assessment & Plan Note (Signed)
Followed by GI - Dr Wohl Recheck CMP and CBC today Encouraged to make f/u appt with GI 

## 2019-07-24 NOTE — Patient Instructions (Addendum)
The CDC recommends two doses of Shingrix (the shingles vaccine) separated by 2 to 6 months for adults age 68 years and older. I recommend checking with your insurance plan regarding coverage for this vaccine.  -  Also ask about tetanus vaccine  - schedule mammogram and GI follow-up  Preventive Care 99 Years and Older, Female Preventive care refers to lifestyle choices and visits with your health care provider that can promote health and wellness. This includes:  A yearly physical exam. This is also called an annual well check.  Regular dental and eye exams.  Immunizations.  Screening for certain conditions.  Healthy lifestyle choices, such as diet and exercise. What can I expect for my preventive care visit? Physical exam Your health care provider will check:  Height and weight. These may be used to calculate body mass index (BMI), which is a measurement that tells if you are at a healthy weight.  Heart rate and blood pressure.  Your skin for abnormal spots. Counseling Your health care provider may ask you questions about:  Alcohol, tobacco, and drug use.  Emotional well-being.  Home and relationship well-being.  Sexual activity.  Eating habits.  History of falls.  Memory and ability to understand (cognition).  Work and work Statistician.  Pregnancy and menstrual history. What immunizations do I need?  Influenza (flu) vaccine  This is recommended every year. Tetanus, diphtheria, and pertussis (Tdap) vaccine  You may need a Td booster every 10 years. Varicella (chickenpox) vaccine  You may need this vaccine if you have not already been vaccinated. Zoster (shingles) vaccine  You may need this after age 40. Pneumococcal conjugate (PCV13) vaccine  One dose is recommended after age 38. Pneumococcal polysaccharide (PPSV23) vaccine  One dose is recommended after age 67. Measles, mumps, and rubella (MMR) vaccine  You may need at least one dose of MMR if  you were born in 1957 or later. You may also need a second dose. Meningococcal conjugate (MenACWY) vaccine  You may need this if you have certain conditions. Hepatitis A vaccine  You may need this if you have certain conditions or if you travel or work in places where you may be exposed to hepatitis A. Hepatitis B vaccine  You may need this if you have certain conditions or if you travel or work in places where you may be exposed to hepatitis B. Haemophilus influenzae type b (Hib) vaccine  You may need this if you have certain conditions. You may receive vaccines as individual doses or as more than one vaccine together in one shot (combination vaccines). Talk with your health care provider about the risks and benefits of combination vaccines. What tests do I need? Blood tests  Lipid and cholesterol levels. These may be checked every 5 years, or more frequently depending on your overall health.  Hepatitis C test.  Hepatitis B test. Screening  Lung cancer screening. You may have this screening every year starting at age 19 if you have a 30-pack-year history of smoking and currently smoke or have quit within the past 15 years.  Colorectal cancer screening. All adults should have this screening starting at age 38 and continuing until age 92. Your health care provider may recommend screening at age 37 if you are at increased risk. You will have tests every 1-10 years, depending on your results and the type of screening test.  Diabetes screening. This is done by checking your blood sugar (glucose) after you have not eaten for a while (fasting).  You may have this done every 1-3 years.  Mammogram. This may be done every 1-2 years. Talk with your health care provider about how often you should have regular mammograms.  BRCA-related cancer screening. This may be done if you have a family history of breast, ovarian, tubal, or peritoneal cancers. Other tests  Sexually transmitted disease (STD)  testing.  Bone density scan. This is done to screen for osteoporosis. You may have this done starting at age 109. Follow these instructions at home: Eating and drinking  Eat a diet that includes fresh fruits and vegetables, whole grains, lean protein, and low-fat dairy products. Limit your intake of foods with high amounts of sugar, saturated fats, and salt.  Take vitamin and mineral supplements as recommended by your health care provider.  Do not drink alcohol if your health care provider tells you not to drink.  If you drink alcohol: ? Limit how much you have to 0-1 drink a day. ? Be aware of how much alcohol is in your drink. In the [place] one drink equals one 12 oz bottle of beer (355 mL), one 5 oz glass of wine (148 mL), or one 1 oz glass of hard liquor (44 mL). Lifestyle  Take daily care of your teeth and gums.  Stay active. Exercise for at least 30 minutes on 5 or more days each week.  Do not use any products that contain nicotine or tobacco, such as cigarettes, e-cigarettes, and chewing tobacco. If you need help quitting, ask your health care provider.  If you are sexually active, practice safe sex. Use a condom or other form of protection in order to prevent STIs (sexually transmitted infections).  Talk with your health care provider about taking a low-dose aspirin or statin. What's next?  Go to your health care provider once a year for a well check visit.  Ask your health care provider how often you should have your eyes and teeth checked.  Stay up to date on all vaccines. This information is not intended to replace advice given to you by your health care provider. Make sure you discuss any questions you have with your health care provider. Document Released: 10/02/2015 Document Revised: 08/30/2018 Document Reviewed: 08/30/2018 Elsevier Patient Education  2020 [employer].

## 2019-07-24 NOTE — Assessment & Plan Note (Signed)
Continue simvastatin Recheck FLP and CMP 

## 2019-07-24 NOTE — Assessment & Plan Note (Signed)
Followed by GI - Dr Allen Norris Recheck CMP and CBC today Encouraged to make f/u appt with GI

## 2019-07-24 NOTE — Assessment & Plan Note (Addendum)
Uncontrolled - white coat hypertension Better on manual recheck Well controlled at home Continue amlodipine 7.5 mg daily Repeat metabolic panel F/u in 6 months

## 2019-07-24 NOTE — Progress Notes (Signed)
Patient: Angela BlazerLinda M Valenzuela, Female    DOB: 1951/04/23, 68 y.o.   MRN: [id number] Visit Date: 07/24/2019  Today's Provider: Shirlee LatchAngela Emmalia Heyboer, MD   Chief Complaint  Patient presents with  . Annual Exam   Subjective:     Complete Physical Angela Valenzuela is a 68 y.o. female. She feels well. She reports exercising no. She reports she is sleeping well.  12/27/17 Mammogram-BI-RADS 1 10/10/17 BMD-Ostepenia 04/22/08 Colonoscopy-normal, FIT test negative 05/2019 -----------------------------------------------------------   Review of Systems  Constitutional: Negative.   HENT: Negative.   Eyes: Negative.   Respiratory: Negative.   Cardiovascular: Negative.   Gastrointestinal: Negative.   Endocrine: Negative.   Genitourinary: Negative.   Musculoskeletal: Negative.   Skin: Negative.   Allergic/Immunologic: Negative.   Neurological: Negative.   Hematological: Negative.   Psychiatric/Behavioral: Negative.     Social History   Socioeconomic History  . Marital status: Married    Spouse name: Not on file  . Number of children: 1  . Years of education: Not on file  . Highest education level: Some college, no degree  Occupational History  . Occupation: retired  Engineer, productionocial Needs  . Financial resource strain: Not hard at all  . Food insecurity    Worry: Never true    Inability: Never true  . Transportation needs    Medical: No    Non-medical: No  Tobacco Use  . Smoking status: Never Smoker  . Smokeless tobacco: Never Used  Substance and Sexual Activity  . Alcohol use: Not Currently    Alcohol/week: 0.0 standard drinks  . Drug use: No  . Sexual activity: Not on file  Lifestyle  . Physical activity    Days per week: 0 days    Minutes per session: 0 min  . Stress: Not at all  Relationships  . Social Musicianconnections    Talks on phone: Patient refused    Gets together: Patient refused    Attends religious service: Patient refused    Active member of club or organization:  Patient refused    Attends meetings of clubs or organizations: Patient refused    Relationship status: Patient refused  . Intimate partner violence    Fear of current or ex partner: Patient refused    Emotionally abused: Patient refused    Physically abused: Patient refused    Forced sexual activity: Patient refused  Other Topics Concern  . Not on file  Social History Narrative  . Not on file    Past Medical History:  Diagnosis Date  . Allergy   . Hyperlipidemia   . Hypertension   . Increased liver enzymes 03/17/2015  . Paroxysmal digital cyanosis 03/17/2015   Overview:   a.  Positive anticentromere antibody b.   Sicca-xerostomia c.  RVSP 41 in 2013   . Raynaud's disease 04/30/2015  . Thyroid disease      Patient Active Problem List   Diagnosis Date Noted  . Osteopenia of neck of left femur 10/18/2017  . Posterior tibial tendinitis of left leg 08/17/2017  . Peripheral neuropathy 07/14/2017  . Primary biliary cholangitis (HCC) 11/22/2016  . Hepatic cirrhosis due to primary biliary cholangitis (HCC) 07/01/2016  . Kidney cysts 06/24/2015  . Dermatitis, eczematoid 06/24/2015  . Dry mouth 06/24/2015  . 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 Subclinical hypothyroidism 06/24/2015  . Epidermoid carcinoma 06/24/2015  . Hypertension 04/30/2015  . Hyperlipidemia 04/30/2015  . Paroxysmal digital cyanosis 03/17/2015  . Raynaud's syndrome without gangrene 01/09/2008  . Allergic rhinitis  01/21/1999    History reviewed. No pertinent surgical history.  Her family history includes Alzheimer's disease in her mother; Hypertension in her father; Stroke in her father.   Current Outpatient Medications:  .  amLODipine (NORVASC) 5 MG tablet, TAKE 1.5 TABLETS (7.5 MG TOTAL) BY MOUTH DAILY., Disp: 45 tablet, Rfl: 1 .  calcium carbonate (TUMS EX) 750 MG chewable tablet, Chew 1 tablet by mouth daily., Disp: , Rfl:  .  Calcium-Vitamin D 600-200 MG-UNIT per tablet, Take by mouth., Disp: , Rfl:   .  cycloSPORINE (RESTASIS) 0.05 % ophthalmic emulsion, 1 drop 2 (two) times daily., Disp: , Rfl:  .  ibuprofen (ADVIL) 200 MG tablet, ADVIL,  (Oral Tablet)  1 Every Day, As Needed for 0 days  Quantity: 0.00;  Refills: 0   Ordered :11-Nov-2011  Amie Critchley ;  Started 01-Jan-2008 Active Comments: Medication taken as needed. , Disp: , Rfl:  .  loratadine (CLARITIN) 10 MG tablet, Take 1 tablet by mouth daily., Disp: , Rfl:  .  meloxicam (MOBIC) 7.5 MG tablet, Take 7.5 mg by mouth daily., Disp: , Rfl: 3 .  PREVIDENT 5000 DRY MOUTH 1.1 % GEL dental gel, USE AS DIRECTED 1 TIME DAILY, Disp: , Rfl: 3 .  RA KRILL OIL 500 MG CAPS, Take by mouth., Disp: , Rfl:  .  simvastatin (ZOCOR) 20 MG tablet, TAKE 1 TABLET BY MOUTH EVERYDAY AT BEDTIME, Disp: 90 tablet, Rfl: 3 .  ursodiol (ACTIGALL) 500 MG tablet, Take 1 tablet (500 mg total) by mouth 3 (three) times daily. **PLEASE SCHEDULE FOLLOW UP APPT**, Disp: 270 tablet, Rfl: 0  Patient Care Team: Erasmo Downer, MD as PCP - General (Family Medicine) Kandyce Rud., MD (Rheumatology) Midge Minium, MD as Consulting Physician (Gastroenterology)     Objective:    Vitals: BP 140/78 (BP Location: Left Arm, Patient Position: Sitting, Cuff Size: Large)   Pulse 91   Temp 98.7 F (37.1 C) (Oral)   Resp 16   Ht  (1.676 m)   Wt 177 lb (80.3 kg)   BMI 28.57 kg/m   Physical Exam Vitals signs reviewed.  Constitutional:      General: She is not in acute distress.    Appearance: Normal appearance. She is well-developed. She is not diaphoretic.  HENT:     Head: Normocephalic and atraumatic.     Right Ear: Tympanic membrane, ear canal and external ear normal.     Left Ear: Tympanic membrane, ear canal and external ear normal.  Eyes:     General: No scleral icterus.    Conjunctiva/sclera: Conjunctivae normal.     Pupils: Pupils are equal, round, and reactive to light.  Neck:     Musculoskeletal: Neck supple.     Thyroid: No  thyromegaly.  Cardiovascular:     Rate and Rhythm: Normal rate and regular rhythm.     Pulses: Normal pulses.     Heart sounds: Normal heart sounds. No murmur.  Pulmonary:     Effort: Pulmonary effort is normal. No respiratory distress.     Breath sounds: Normal breath sounds. No wheezing or rales.  Abdominal:     General: Bowel sounds are normal. There is no distension.     Palpations: Abdomen is soft.     Tenderness: There is no abdominal tenderness. There is no guarding or rebound.  Musculoskeletal:        General: No deformity.     Right lower leg: No edema.  Left lower leg: No edema.  Lymphadenopathy:     Cervical: No cervical adenopathy.  Skin:    General: Skin is warm and dry.     Capillary Refill: Capillary refill takes less than 2 seconds.     Findings: No rash.  Neurological:     Mental Status: She is alert and oriented to person, place, and time. Mental status is at baseline.  Psychiatric:        Mood and Affect: Mood normal.        Behavior: Behavior normal.        Thought Content: Thought content normal.     Activities of Daily Living In your present state of health, do you have any difficulty performing the following activities: 07/24/2019  Hearing? Y  Comment Does not wear hearing aids.  Vision? N  Difficulty concentrating or making decisions? N  Walking or climbing stairs? N  Dressing or bathing? N  Doing errands, shopping? N  Preparing Food and eating ? N  Using the Toilet? N  In the past six months, have you accidently leaked urine? N  Do you have problems with loss of bowel control? N  Managing your Medications? N  Managing your Finances? N  Housekeeping or managing your Housekeeping? N  Some recent data might be hidden    Fall Risk Assessment Fall Risk  07/24/2019 07/24/2019 01/16/2019 07/18/2018 07/14/2017  Falls in the past year? 0 0 0 No No  Number falls in past yr: 0 0 - - -  Injury with Fall? 0 0 - - -     Depression Screen PHQ 2/9  Scores 07/24/2019 07/18/2018 07/14/2017 07/01/2016  PHQ - 2 Score 0 0 0 0  PHQ- 9 Score - 0 0 -    6CIT Screen 07/14/2017  What Year? 0 points  What month? 0 points  What time? 0 points  Count back from 20 0 points  Months in reverse 0 points  Repeat phrase 0 points  Total Score 0       Assessment & Plan:    Annual Physical Reviewed patient's Family Medical History Reviewed and updated list of patient's medical providers Assessment of cognitive impairment was done Assessed patient's functional ability Established a written schedule for health screening [place] Completed and Reviewed  Exercise Activities and Dietary recommendations Goals    . DIET - REDUCE SUGAR INTAKE     Recommend to cut back on sugar in daily diet to help aid in weight loss.    . Exercise 150 minutes per week (moderate activity)       Immunization History  Administered Date(s) Administered  . Fluad Quad(high Dose 65+) 07/24/2019  . Influenza Split 08/08/2012  . Influenza, High Dose Seasonal PF 07/01/2016, 07/14/2017, 07/18/2018  . Influenza,inj,Quad PF,6+ Mos 07/27/2013, 08/12/2014, 06/25/2015  . Pneumococcal Conjugate-13 11/01/2018  . Pneumococcal Polysaccharide-23 07/14/2017  . Tdap 01/11/2008    Health Maintenance  Topic Date Due  . TETANUS/TDAP  07/19/2023 (Originally 01/10/2018)  . MAMMOGRAM  12/28/2019  . Fecal DNA (Cologuard)  06/12/2022  . DEXA SCAN  10/10/2022  . INFLUENZA VACCINE  Completed  . Hepatitis C Screening  Completed  . PNA vac Low Risk Adult  Completed     Discussed health benefits of physical activity, and encouraged her to engage in regular exercise appropriate for her age and condition.    Advised to check with insurance re Td and shingrix vaccine coverage Advised to re-schedule mammogram ------------------------------------------------------------------------------------------------------------  Problem List Items Addressed  This Visit       Cardiovascular and Mediastinum   Hypertension    Uncontrolled - white coat hypertension Better on manual recheck Well controlled at home Continue amlodipine 7.5 mg daily Repeat metabolic panel F/u in 6 months      Relevant Orders   CBC with Differential/Platelet   Comprehensive metabolic panel     Digestive   Hepatic cirrhosis due to primary biliary cholangitis (HCC)    Followed by GI - Dr Servando Snare Recheck CMP and CBC today Encouraged to make f/u appt with GI      Relevant Orders   CBC with Differential/Platelet   Comprehensive metabolic panel   Primary biliary cholangitis (HCC)    Followed by GI - Dr Servando Snare Recheck CMP and CBC today Encouraged to make f/u appt with GI      Relevant Orders   CBC with Differential/Platelet   Comprehensive metabolic panel     Endocrine   Subclinical hypothyroidism    Recheck TSH and Free T4 Not on medications      Relevant Orders   TSH + free T4     Nervous and Auditory   Peripheral neuropathy    Ongoing and mild Previous B12 level normal Suspect mechanical issue due to loss of transverse arch No additional workup necessary        Musculoskeletal and Integument   Buschke's scleredema (HCC)    Followed by Rheumatology regularly      Relevant Orders   CBC with Differential/Platelet   Comprehensive metabolic panel     Other   Hyperlipidemia    Continue simvastatin Recheck FLP and CMP      Relevant Orders   Comprehensive metabolic panel   Lipid Panel With LDL/HDL Ratio    Other Visit Diagnoses    Encounter for annual physical exam    -  Primary   Relevant Orders   CBC with Differential/Platelet   Comprehensive metabolic panel   Lipid Panel With LDL/HDL Ratio   TSH + free T4       Return in about 6 months (around 01/21/2020) for chronic disease f/u.   The entirety of the information documented in the History of Present Illness, Review of Systems and Physical Exam were personally obtained by me. Portions of this  information were initially documented by Rondel Baton, CMA and reviewed by me for thoroughness and accuracy.    Brycin Kille, Marzella Schlein, MD MPH [facility]

## 2019-07-24 NOTE — Assessment & Plan Note (Signed)
Recheck TSH and Free T4 Not on medications

## 2019-07-27 LAB — COMPREHENSIVE METABOLIC PANEL
ALT: 16 IU/L (ref 0–32)
AST: 18 IU/L (ref 0–40)
Albumin/Globulin Ratio: 2.6 — ABNORMAL HIGH (ref 1.2–2.2)
Albumin: 4.4 g/dL (ref 3.8–4.8)
Alkaline Phosphatase: 108 IU/L (ref 39–117)
BUN/Creatinine Ratio: 22 (ref 12–28)
BUN: 16 mg/dL (ref 8–27)
Bilirubin Total: 0.4 mg/dL (ref 0.0–1.2)
CO2: 24 mmol/L (ref 20–29)
Calcium: 9.2 mg/dL (ref 8.7–10.3)
Chloride: 104 mmol/L (ref 96–106)
Creatinine, Ser: 0.72 mg/dL (ref 0.57–1.00)
GFR calc Af Amer: 100 mL/min/{1.73_m2} (ref >59)
GFR calc non Af Amer: 86 mL/min/{1.73_m2} (ref >59)
Globulin, Total: 1.7 g/dL (ref 1.5–4.5)
Glucose: 92 mg/dL (ref 65–99)
Potassium: 4.2 mmol/L (ref 3.5–5.2)
Sodium: 141 mmol/L (ref 134–144)
Total Protein: 6.1 g/dL (ref 6.0–8.5)

## 2019-07-27 LAB — CBC WITH DIFFERENTIAL/PLATELET
Basophils Absolute: 0 10*3/uL (ref 0.0–0.2)
Basos: 1 %
EOS (ABSOLUTE): 0.1 10*3/uL (ref 0.0–0.4)
Eos: 2 %
Hematocrit: 43.4 % (ref 34.0–46.6)
Hemoglobin: 14.1 g/dL (ref 11.1–15.9)
Immature Grans (Abs): 0 10*3/uL (ref 0.0–0.1)
Immature Granulocytes: 0 %
Lymphocytes Absolute: 1.3 10*3/uL (ref 0.7–3.1)
Lymphs: 27 %
MCH: 28.5 pg (ref 26.6–33.0)
MCHC: 32.5 g/dL (ref 31.5–35.7)
MCV: 88 fL (ref 79–97)
Monocytes Absolute: 0.4 10*3/uL (ref 0.1–0.9)
Monocytes: 9 %
Neutrophils Absolute: 3 10*3/uL (ref 1.4–7.0)
Neutrophils: 61 %
Platelets: 221 10*3/uL (ref 150–450)
RBC: 4.95 x10E6/uL (ref 3.77–5.28)
RDW: 12.7 % (ref 11.7–15.4)
WBC: 4.9 10*3/uL (ref 3.4–10.8)

## 2019-07-27 LAB — LIPID PANEL WITH LDL/HDL RATIO
Cholesterol, Total: 175 mg/dL (ref 100–199)
HDL: 91 mg/dL (ref >39)
LDL Chol Calc (NIH): 71 mg/dL (ref 0–99)
LDL/HDL Ratio: 0.8 ratio (ref 0.0–3.2)
Triglycerides: 66 mg/dL (ref 0–149)
VLDL Cholesterol Cal: 13 mg/dL (ref 5–40)

## 2019-07-27 LAB — TSH+FREE T4
Free T4: 1.26 ng/dL (ref 0.82–1.77)
TSH: 3.58 u[IU]/mL (ref 0.450–4.500)

## 2019-07-29 ENCOUNTER — Telehealth: Payer: Self-pay

## 2019-07-29 NOTE — Telephone Encounter (Signed)
Pt advised.   Thanks,   -Xoe Hoe  

## 2019-07-29 NOTE — Telephone Encounter (Signed)
-----   Message from Virginia Crews, MD sent at 07/29/2019  9:03 AM EST ----- Normal labs

## 2019-08-01 ENCOUNTER — Other Ambulatory Visit: Payer: Self-pay | Admitting: Family Medicine

## 2019-08-22 ENCOUNTER — Ambulatory Visit: Payer: Medicare Other | Admitting: Gastroenterology

## 2019-08-22 ENCOUNTER — Encounter: Payer: Self-pay | Admitting: Gastroenterology

## 2019-08-22 ENCOUNTER — Other Ambulatory Visit: Payer: Self-pay

## 2019-08-22 VITALS — BP 172/74 | HR 97 | Temp 98.2°F | Ht 66.0 in | Wt 176.4 lb

## 2019-08-22 DIAGNOSIS — K743 Primary biliary cirrhosis: Secondary | ICD-10-CM

## 2019-08-22 MED ORDER — URSODIOL 500 MG PO TABS: 500.0000 mg | ORAL_TABLET | Freq: Three times a day (TID) | ORAL | 3 refills | Status: DC

## 2019-08-22 NOTE — Progress Notes (Signed)
Primary Care Physician: Erasmo Downer, MD  Primary Gastroenterologist:  Dr. Midge Minium  Chief Complaint  Patient presents with  . Primary biliary cirrhosis    HPI: Angela Valenzuela is a 68 y.o. female here for follow-up of PBC.  The patient was diagnosed with PBC and started on ursodeoxycholic acid.  The patient has had an elevated alkaline phosphatase for some time prior to 2018 when I last saw the patient.  She was then started on Actigall and her alkaline phosphatase came back to normal.  The patient also had an AMA positive.  The patient's most recent lab work showed her to have normal liver enzymes.  Her labs were as follows:  Component     Latest Ref Rng & Units 03/27/2018 07/26/2019  Total Bilirubin     0.0 - 1.2 mg/dL 0.3 0.4  Alkaline Phosphatase     39 - 117 IU/L 115 108  AST     0 - 40 IU/L 17 18  ALT     0 - 32 IU/L 15 16   The patient had a negative FIT tests in September of this year.  The patient denies any abdominal pain nausea vomiting fevers or chills.  She does have some intermittent diarrhea that she attributes to the medication.  She is now on 500 mg 3 times a day.  Her most recent lab work showed normal LFTs.  The patient is questioning whether she can have a cocktail occasionally.  Current Outpatient Medications  Medication Sig Dispense Refill  . amLODipine (NORVASC) 5 MG tablet TAKE 1.5 TABLETS (7.5 MG TOTAL) BY MOUTH DAILY. 45 tablet 1  . calcium carbonate (TUMS EX) 750 MG chewable tablet Chew 1 tablet by mouth daily.    . Calcium-Vitamin D 600-200 MG-UNIT per tablet Take by mouth.    . loratadine (CLARITIN) 10 MG tablet Take 1 tablet by mouth daily.    Marland Kitchen PREVIDENT 5000 DRY MOUTH 1.1 % GEL dental gel USE AS DIRECTED 1 TIME DAILY  3  . RA KRILL OIL 500 MG CAPS Take by mouth.    . simvastatin (ZOCOR) 20 MG tablet TAKE 1 TABLET BY MOUTH EVERYDAY AT BEDTIME 90 tablet 3  . ursodiol (ACTIGALL) 500 MG tablet Take 1 tablet (500 mg total) by mouth 3 (three)  times daily. **PLEASE SCHEDULE FOLLOW UP APPT** 270 tablet 0  . cycloSPORINE (RESTASIS) 0.05 % ophthalmic emulsion 1 drop 2 (two) times daily.    Marland Kitchen ibuprofen (ADVIL) 200 MG tablet ADVIL, 200MG  (Oral Tablet)  1 Every Day, As Needed for 0 days  Quantity: 0.00;  Refills: 0   Ordered :11-Nov-2011  13-Nov-2011 ;  Started 01-Jan-2008 Active Comments: Medication taken as needed.     . meloxicam (MOBIC) 7.5 MG tablet Take 7.5 mg by mouth daily.  3   No current facility-administered medications for this visit.     Allergies as of 08/22/2019  . (No Known Allergies)    ROS:  General: Negative for anorexia, weight loss, fever, chills, fatigue, weakness. ENT: Negative for hoarseness, difficulty swallowing , nasal congestion. CV: Negative for chest pain, angina, palpitations, dyspnea on exertion, peripheral edema.  Respiratory: Negative for dyspnea at rest, dyspnea on exertion, cough, sputum, wheezing.  GI: See history of present illness. GU:  Negative for dysuria, hematuria, urinary incontinence, urinary frequency, nocturnal urination.  Endo: Negative for unusual weight change.    Physical Examination:   BP (!) 172/74   Pulse 97   Temp 98.2 F (  36.8 C) (Oral)   Ht 5\' 6"  (1.676 m)   Wt 176 lb 6.4 oz (80 kg)   BMI 28.47 kg/m   General: Well-nourished, well-developed in no acute distress.  Eyes: No icterus. Conjunctivae pink. Lungs: Clear to auscultation bilaterally. Non-labored. Heart: Regular rate and rhythm, no murmurs rubs or gallops.  Abdomen: Bowel sounds are normal, nontender, nondistended, no hepatosplenomegaly or masses, no abdominal bruits or hernia , no rebound or guarding.   Extremities: No lower extremity edema. No clubbing or deformities. Neuro: Alert and oriented x 3.  Grossly intact. Skin: Warm and dry, no jaundice.   Psych: Alert and cooperative, normal mood and affect.  Labs:    Imaging Studies: No results found.  Assessment and Plan:   Angela Valenzuela  is a 68 y.o. y/o female who comes in today with a history of PBC.  The patient has normalized her liver enzymes on Actigall.  The patient will continue her present dose of this.  The patient has been explained the pathophysiology of PBC and how the medication is working.  She has been told that she can take an occasional drink without any worries for her liver disease.  The patient's lab work and imaging do not suggest that the patient has any signs of cirrhosis.  The patient will contact me if she has any further issues.  She will follow up with her primary care provider for continued colon cancer screening since she has been doing the Cologuard.     Lucilla Lame, MD. Marval Regal    Note: This dictation was prepared with Dragon dictation along with smaller phrase technology. Any transcriptional errors that result from this process are unintentional.

## 2019-09-03 ENCOUNTER — Other Ambulatory Visit: Payer: Self-pay | Admitting: Family Medicine

## 2019-10-02 ENCOUNTER — Other Ambulatory Visit: Payer: Self-pay | Admitting: Family Medicine

## 2019-10-08 ENCOUNTER — Ambulatory Visit: Payer: Medicare Other | Admitting: Gastroenterology

## 2019-10-20 ENCOUNTER — Encounter: Payer: Self-pay | Admitting: Family Medicine

## 2019-11-06 ENCOUNTER — Other Ambulatory Visit: Payer: Self-pay | Admitting: Family Medicine

## 2019-11-12 DIAGNOSIS — C44519 Basal cell carcinoma of skin of other part of trunk: Secondary | ICD-10-CM | POA: Diagnosis not present

## 2019-11-12 DIAGNOSIS — Z85828 Personal history of other malignant neoplasm of skin: Secondary | ICD-10-CM | POA: Diagnosis not present

## 2019-11-12 DIAGNOSIS — D2262 Melanocytic nevi of left upper limb, including shoulder: Secondary | ICD-10-CM | POA: Diagnosis not present

## 2019-11-12 DIAGNOSIS — D2271 Melanocytic nevi of right lower limb, including hip: Secondary | ICD-10-CM | POA: Diagnosis not present

## 2019-11-12 DIAGNOSIS — L821 Other seborrheic keratosis: Secondary | ICD-10-CM | POA: Diagnosis not present

## 2019-11-12 DIAGNOSIS — D2261 Melanocytic nevi of right upper limb, including shoulder: Secondary | ICD-10-CM | POA: Diagnosis not present

## 2019-11-12 DIAGNOSIS — D485 Neoplasm of uncertain behavior of skin: Secondary | ICD-10-CM | POA: Diagnosis not present

## 2019-11-21 DIAGNOSIS — M3501 Sicca syndrome with keratoconjunctivitis: Secondary | ICD-10-CM | POA: Diagnosis not present

## 2019-11-21 DIAGNOSIS — M349 Systemic sclerosis, unspecified: Secondary | ICD-10-CM | POA: Diagnosis not present

## 2019-11-21 DIAGNOSIS — K743 Primary biliary cirrhosis: Secondary | ICD-10-CM | POA: Diagnosis not present

## 2019-11-25 ENCOUNTER — Other Ambulatory Visit: Payer: Self-pay | Admitting: Family Medicine

## 2019-11-25 DIAGNOSIS — Z1231 Encounter for screening mammogram for malignant neoplasm of breast: Secondary | ICD-10-CM

## 2019-12-05 ENCOUNTER — Other Ambulatory Visit: Payer: Self-pay | Admitting: Family Medicine

## 2019-12-25 DIAGNOSIS — C44519 Basal cell carcinoma of skin of other part of trunk: Secondary | ICD-10-CM | POA: Diagnosis not present

## 2020-01-02 ENCOUNTER — Other Ambulatory Visit: Payer: Self-pay | Admitting: Family Medicine

## 2020-01-07 ENCOUNTER — Ambulatory Visit
Admission: RE | Admit: 2020-01-07 | Discharge: 2020-01-07 | Disposition: A | Discharge status: Home / Self Care | Payer: Medicare Other | Source: Ambulatory Visit | Attending: Family Medicine | Admitting: Family Medicine

## 2020-01-07 ENCOUNTER — Other Ambulatory Visit: Payer: Self-pay

## 2020-01-07 DIAGNOSIS — Z1231 Encounter for screening mammogram for malignant neoplasm of breast: Secondary | ICD-10-CM

## 2020-01-08 ENCOUNTER — Other Ambulatory Visit: Payer: Self-pay | Admitting: Family Medicine

## 2020-01-08 DIAGNOSIS — R928 Other abnormal and inconclusive findings on diagnostic imaging of breast: Secondary | ICD-10-CM

## 2020-01-20 ENCOUNTER — Telehealth: Payer: Self-pay

## 2020-01-20 ENCOUNTER — Ambulatory Visit
Admission: RE | Admit: 2020-01-20 | Discharge: 2020-01-20 | Disposition: A | Discharge status: Home / Self Care | Payer: Medicare PPO | Source: Ambulatory Visit | Attending: Family Medicine | Admitting: Family Medicine

## 2020-01-20 ENCOUNTER — Other Ambulatory Visit: Payer: Self-pay

## 2020-01-20 DIAGNOSIS — R928 Other abnormal and inconclusive findings on diagnostic imaging of breast: Secondary | ICD-10-CM | POA: Diagnosis not present

## 2020-01-20 DIAGNOSIS — N6001 Solitary cyst of right breast: Secondary | ICD-10-CM | POA: Diagnosis not present

## 2020-01-20 NOTE — Telephone Encounter (Signed)
Pt advised.   Thanks,   -Caleb Prigmore  

## 2020-01-20 NOTE — Telephone Encounter (Signed)
-----   Message from Erasmo Downer, MD sent at 01/20/2020  4:22 PM EDT ----- Normal mammogram and ultrasound. Repeat screening mammogram in 1 yr

## 2020-01-21 ENCOUNTER — Ambulatory Visit: Payer: Self-pay | Admitting: Family Medicine

## 2020-01-22 DIAGNOSIS — H04123 Dry eye syndrome of bilateral lacrimal glands: Secondary | ICD-10-CM | POA: Diagnosis not present

## 2020-01-22 DIAGNOSIS — H18529 Epithelial (juvenile) corneal dystrophy, unspecified eye: Secondary | ICD-10-CM | POA: Diagnosis not present

## 2020-01-31 ENCOUNTER — Other Ambulatory Visit: Payer: Self-pay

## 2020-01-31 ENCOUNTER — Ambulatory Visit (INDEPENDENT_AMBULATORY_CARE_PROVIDER_SITE_OTHER): Payer: Medicare PPO | Admitting: Family Medicine

## 2020-01-31 ENCOUNTER — Encounter: Payer: Self-pay | Admitting: Family Medicine

## 2020-01-31 VITALS — BP 115/72 | HR 81 | Temp 97.7°F | Resp 16 | Ht 66.0 in | Wt 174.2 lb

## 2020-01-31 DIAGNOSIS — K743 Primary biliary cirrhosis: Secondary | ICD-10-CM | POA: Diagnosis not present

## 2020-01-31 DIAGNOSIS — E782 Mixed hyperlipidemia: Secondary | ICD-10-CM

## 2020-01-31 DIAGNOSIS — I10 Essential (primary) hypertension: Secondary | ICD-10-CM | POA: Diagnosis not present

## 2020-01-31 DIAGNOSIS — E038 Other specified hypothyroidism: Secondary | ICD-10-CM

## 2020-01-31 DIAGNOSIS — M349 Systemic sclerosis, unspecified: Secondary | ICD-10-CM | POA: Diagnosis not present

## 2020-01-31 DIAGNOSIS — E039 Hypothyroidism, unspecified: Secondary | ICD-10-CM | POA: Diagnosis not present

## 2020-01-31 NOTE — Progress Notes (Signed)
Established patient visit   Patient: Angela Valenzuela   DOB: August 09, 1951   69 y.o. Female  MRN: [id number] Visit Date: 01/31/2020  Today's healthcare provider: [name], MD   Chief Complaint  Patient presents with  . Hypertension   Subjective    HPI Hypertension, follow-up  BP Readings from Last 3 Encounters:  01/31/20 115/72  08/22/19 (!) 172/74  07/24/19 140/78   Wt Readings from Last 3 Encounters:  01/31/20 174 lb 3.2 oz (79 kg)  08/22/19 176 lb 6.4 oz (80 kg)  07/24/19 177 lb (80.3 kg)     She was last seen for hypertension 6 months ago.  BP at that visit was well controlled. Management since that visit includes continue amlodipine.  She reports excellent compliance with treatment. She is having side effects. edema She is following a Low Sodium diet. She is exercising. She does not smoke.  Use of agents associated with hypertension: none.   Outside blood pressures are well controlled. Symptoms: No chest pain No chest pressure  No palpitations No syncope  No dyspnea No orthopnea  No paroxysmal nocturnal dyspnea Yes lower extremity edema   Pertinent labs: Lab Results  Component Value Date   CHOL 175 07/26/2019   HDL 91 07/26/2019   LDLCALC 71 07/26/2019   TRIG 66 07/26/2019   CHOLHDL 2.2 03/27/2018   Lab Results  Component Value Date   NA 141 07/26/2019   K 4.2 07/26/2019   CREATININE 0.72 07/26/2019   GFRNONAA 86 07/26/2019   GFRAA 100 07/26/2019   GLUCOSE 92 07/26/2019     The 10-year ASCVD risk score Mikey Bussing DC Jr., et al., 2013) is: 7.9%   --------------------------------------------------------------------------------------------------- Lipid/Cholesterol, Follow-up  Last lipid panel Other pertinent labs  Lab Results  Component Value Date   CHOL 175 07/26/2019   HDL 91 07/26/2019   LDLCALC 71 07/26/2019   TRIG 66 07/26/2019   CHOLHDL 2.2 03/27/2018   Lab Results  Component Value Date   ALT 16 07/26/2019   AST 18 07/26/2019    PLT 221 07/26/2019   TSH 3.580 07/26/2019     She was last seen for this 6 months ago.  Management since that visit includes continue simvastatin.  She reports excellent compliance with treatment. She is not having side effects.   Symptoms: No chest pain No chest pressure/discomfort  No dyspnea Yes lower extremity edema  No numbness or tingling of extremity No orthopnea  No palpitations No paroxysmal nocturnal dyspnea  No speech difficulty No syncope   Current diet: in general, a "healthy" diet   Current exercise: not asked  The 10-year ASCVD risk score Mikey Bussing DC Brooke Bonito., et al., 2013) is: 7.9%  ---------------------------------------------------------------------------------------------------    Patient Active Problem List   Diagnosis Date Noted  . Osteopenia of neck of left femur 10/18/2017  . Posterior tibial tendinitis of left leg 08/17/2017  . Peripheral neuropathy 07/14/2017  . Primary biliary cholangitis ([place]) 11/22/2016  . Kidney cysts 06/24/2015  . Dermatitis, eczematoid 06/24/2015  . Dry mouth 06/24/2015  . Buschke's scleredema ([place]) 06/24/2015  . Subclinical hypothyroidism 06/24/2015  . Epidermoid carcinoma 06/24/2015  . Hypertension 04/30/2015  . Hyperlipidemia 04/30/2015  . Paroxysmal digital cyanosis 03/17/2015  . Raynaud's syndrome without gangrene 01/09/2008  . Allergic rhinitis 01/21/1999   Social History   Tobacco Use  . Smoking status: Never Smoker  . Smokeless tobacco: Never Used  Substance Use Topics  . Alcohol use: Not Currently    Alcohol/week: 0.0 standard drinks  .  Drug use: No       Medications: Outpatient Medications Prior to Visit  Medication Sig  . amLODipine (NORVASC) 5 MG tablet TAKE 1.5 TABLETS (7.5 MG TOTAL) BY MOUTH DAILY.  . calcium carbonate (TUMS EX) 750 MG chewable tablet Chew 1 tablet by mouth daily.  . Calcium-Vitamin D 600-200 MG-UNIT per tablet Take by mouth.  . loratadine (CLARITIN) 10 MG tablet Take 1 tablet by  mouth daily.  . meloxicam (MOBIC) 7.5 MG tablet Take 7.5 mg by mouth daily.  Marland Kitchen PREVIDENT 5000 DRY MOUTH 1.1 % GEL dental gel USE AS DIRECTED 1 TIME DAILY  . RA KRILL OIL 500 MG CAPS Take by mouth.  . simvastatin (ZOCOR) 20 MG tablet TAKE 1 TABLET BY MOUTH EVERYDAY AT BEDTIME (Patient taking differently: Take 20 mg by mouth at bedtime. )  . ursodiol (ACTIGALL) 500 MG tablet Take 1 tablet (500 mg total) by mouth 3 (three) times daily.  . [DISCONTINUED] cycloSPORINE (RESTASIS) 0.05 % ophthalmic emulsion 1 drop 2 (two) times daily.  . [DISCONTINUED] ibuprofen (ADVIL) 200 MG tablet ADVIL, 200MG  (Oral Tablet)  1 Every Day, As Needed for 0 days  Quantity: 0.00;  Refills: 0   Ordered :11-Nov-2011  13-Nov-2011 ;  Started 01-Jan-2008 Active Comments: Medication taken as needed.    No facility-administered medications prior to visit.    Review of Systems  Constitutional: Negative.   Respiratory: Negative.   Cardiovascular: Positive for leg swelling. Negative for chest pain and palpitations.  Gastrointestinal: Negative.   Genitourinary: Negative.   Musculoskeletal: Negative.   Neurological: Negative.       Objective    BP 115/72 (BP Location: Left Arm, Patient Position: Sitting, Cuff Size: Normal)   Pulse 81   Temp 97.7 F (36.5 C) (Temporal)   Resp 16   Ht 5\' 6"  (1.676 m)   Wt 174 lb 3.2 oz (79 kg)   BMI 28.12 kg/m  BP Readings from Last 3 Encounters:  01/31/20 115/72  08/22/19 (!) 172/74  07/24/19 140/78   Wt Readings from Last 3 Encounters:  01/31/20 174 lb 3.2 oz (79 kg)  08/22/19 176 lb 6.4 oz (80 kg)  07/24/19 177 lb (80.3 kg)      Physical Exam Vitals reviewed.  Constitutional:      General: She is not in acute distress.    Appearance: Normal appearance. She is well-developed. She is not diaphoretic.  HENT:     Head: Normocephalic and atraumatic.  Eyes:     General: No scleral icterus.    Conjunctiva/sclera: Conjunctivae normal.  Neck:     Thyroid: No  thyromegaly.  Cardiovascular:     Rate and Rhythm: Normal rate and regular rhythm.     Pulses: Normal pulses.     Heart sounds: Normal heart sounds. No murmur.  Pulmonary:     Effort: Pulmonary effort is normal. No respiratory distress.     Breath sounds: Normal breath sounds. No wheezing, rhonchi or rales.  Musculoskeletal:     Cervical back: Neck supple.     Right lower leg: Edema (trace) present.     Left lower leg: Edema (trace) present.  Lymphadenopathy:     Cervical: No cervical adenopathy.  Skin:    General: Skin is warm and dry.     Findings: No rash.  Neurological:     Mental Status: She is alert and oriented to person, place, and time. Mental status is at baseline.  Psychiatric:        Mood and  Affect: Mood normal.        Behavior: Behavior normal.      No results found for any visits on 01/31/20.  Assessment & Plan     Problem List Items Addressed This Visit      Cardiovascular and Mediastinum   Hypertension - Primary    Well controlled Continue current medications Recheck metabolic panel F/u in 6 months She is having some lower extremity edema, but it is not bothersome to her, so we will continue amlodipine If edema gets worse in the future, consider switching amlodipine to HCTZ instead      Relevant Orders   Comprehensive metabolic panel     Digestive   Primary biliary cholangitis (HCC)    Recent follow-up with GI-Dr. Servando Snare LFTs have normalized with ursodiol, which we will continue at current dose Discussed that it is okay to have occasional alcohol as her liver disease is not from alcohol related causes From GIs last note, appears that she does not need to have regular follow-up with them, so we will continue to monitor LFTs every 6 months      Relevant Orders   Comprehensive metabolic panel     Endocrine   Subclinical hypothyroidism    Asymptomatic Not on medications Recheck TSH and free T4      Relevant Orders   TSH + free T4      Musculoskeletal and Integument   Buschke's scleredema (HCC)    Followed by rheumatology regularly        Other   Hyperlipidemia    Well-controlled on last lipid panel Continue simvastatin at current dose          Return in about 6 months (around 08/02/2020) for CPE/AWV.      I, [name], MD, have reviewed all documentation for this visit. The documentation on 01/31/20 for the exam, diagnosis, procedures, and orders are all accurate and complete.   Christol Thetford, Marzella Schlein, MD, MPH [facility]

## 2020-01-31 NOTE — Assessment & Plan Note (Signed)
Well controlled Continue current medications Recheck metabolic panel F/u in 6 months She is having some lower extremity edema, but it is not bothersome to her, so we will continue amlodipine If edema gets worse in the future, consider switching amlodipine to HCTZ instead

## 2020-01-31 NOTE — Assessment & Plan Note (Signed)
Followed by rheumatology regularly

## 2020-01-31 NOTE — Assessment & Plan Note (Signed)
Asymptomatic Not on medications Recheck TSH and free T4 

## 2020-01-31 NOTE — Assessment & Plan Note (Signed)
Well-controlled on last lipid panel Continue simvastatin at current dose 

## 2020-01-31 NOTE — Assessment & Plan Note (Signed)
Recent follow-up with GI-Dr. Servando Snare LFTs have normalized with ursodiol, which we will continue at current dose Discussed that it is okay to have occasional alcohol as her liver disease is not from alcohol related causes From GIs last note, appears that she does not need to have regular follow-up with them, so we will continue to monitor LFTs every 6 months

## 2020-02-01 LAB — COMPREHENSIVE METABOLIC PANEL
ALT: 16 IU/L (ref 0–32)
AST: 19 IU/L (ref 0–40)
Albumin/Globulin Ratio: 2.3 — ABNORMAL HIGH (ref 1.2–2.2)
Albumin: 4.6 g/dL (ref 3.8–4.8)
Alkaline Phosphatase: 114 IU/L (ref 39–117)
BUN/Creatinine Ratio: 17 (ref 12–28)
BUN: 13 mg/dL (ref 8–27)
Bilirubin Total: 0.4 mg/dL (ref 0.0–1.2)
CO2: 23 mmol/L (ref 20–29)
Calcium: 9.7 mg/dL (ref 8.7–10.3)
Chloride: 101 mmol/L (ref 96–106)
Creatinine, Ser: 0.78 mg/dL (ref 0.57–1.00)
GFR calc Af Amer: 90 mL/min/{1.73_m2} (ref >59)
GFR calc non Af Amer: 78 mL/min/{1.73_m2} (ref >59)
Globulin, Total: 2 g/dL (ref 1.5–4.5)
Glucose: 102 mg/dL — ABNORMAL HIGH (ref 65–99)
Potassium: 3.8 mmol/L (ref 3.5–5.2)
Sodium: 139 mmol/L (ref 134–144)
Total Protein: 6.6 g/dL (ref 6.0–8.5)

## 2020-02-01 LAB — TSH+FREE T4
Free T4: 1.42 ng/dL (ref 0.82–1.77)
TSH: 2.26 u[IU]/mL (ref 0.450–4.500)

## 2020-02-03 ENCOUNTER — Telehealth: Payer: Self-pay

## 2020-02-03 NOTE — Telephone Encounter (Signed)
-----   Message from Angela M Bacigalupo, MD sent at 02/03/2020  8:35 AM EDT ----- Normal labs 

## 2020-02-03 NOTE — Telephone Encounter (Signed)
[employer]   Result Notes and Comments to Patient Comment seen by patient Angela Valenzuela on 02/03/2020 9:10 AM EDT

## 2020-02-04 ENCOUNTER — Other Ambulatory Visit: Payer: Self-pay | Admitting: Family Medicine

## 2020-02-07 NOTE — Telephone Encounter (Signed)
Discussed lab results with pt.   Thanks,   -Vernona Rieger

## 2020-02-07 NOTE — Telephone Encounter (Signed)
Patient would like to discuss lab results. She states she would like to confirm them although she saw them on her mychart.

## 2020-02-20 DIAGNOSIS — E559 Vitamin D deficiency, unspecified: Secondary | ICD-10-CM | POA: Diagnosis not present

## 2020-02-20 DIAGNOSIS — M25572 Pain in left ankle and joints of left foot: Secondary | ICD-10-CM | POA: Diagnosis not present

## 2020-02-20 DIAGNOSIS — M25571 Pain in right ankle and joints of right foot: Secondary | ICD-10-CM | POA: Diagnosis not present

## 2020-03-05 ENCOUNTER — Other Ambulatory Visit: Payer: Self-pay | Admitting: Family Medicine

## 2020-04-28 DIAGNOSIS — M25571 Pain in right ankle and joints of right foot: Secondary | ICD-10-CM | POA: Diagnosis not present

## 2020-04-28 DIAGNOSIS — M25572 Pain in left ankle and joints of left foot: Secondary | ICD-10-CM | POA: Diagnosis not present

## 2020-04-28 DIAGNOSIS — E559 Vitamin D deficiency, unspecified: Secondary | ICD-10-CM | POA: Diagnosis not present

## 2020-05-03 ENCOUNTER — Encounter: Payer: Self-pay | Admitting: Family Medicine

## 2020-05-04 ENCOUNTER — Encounter: Payer: Self-pay | Admitting: Neurology

## 2020-05-04 ENCOUNTER — Other Ambulatory Visit: Payer: Self-pay

## 2020-05-04 DIAGNOSIS — R202 Paresthesia of skin: Secondary | ICD-10-CM

## 2020-05-05 DIAGNOSIS — H18529 Epithelial (juvenile) corneal dystrophy, unspecified eye: Secondary | ICD-10-CM | POA: Diagnosis not present

## 2020-06-03 ENCOUNTER — Encounter: Payer: Medicare PPO | Admitting: Neurology

## 2020-06-22 DIAGNOSIS — Z85828 Personal history of other malignant neoplasm of skin: Secondary | ICD-10-CM | POA: Diagnosis not present

## 2020-06-22 DIAGNOSIS — D3701 Neoplasm of uncertain behavior of lip: Secondary | ICD-10-CM | POA: Diagnosis not present

## 2020-06-22 DIAGNOSIS — Z08 Encounter for follow-up examination after completed treatment for malignant neoplasm: Secondary | ICD-10-CM | POA: Diagnosis not present

## 2020-06-22 DIAGNOSIS — Z1283 Encounter for screening for malignant neoplasm of skin: Secondary | ICD-10-CM | POA: Diagnosis not present

## 2020-06-22 DIAGNOSIS — D485 Neoplasm of uncertain behavior of skin: Secondary | ICD-10-CM | POA: Diagnosis not present

## 2020-06-22 DIAGNOSIS — L821 Other seborrheic keratosis: Secondary | ICD-10-CM | POA: Diagnosis not present

## 2020-06-28 ENCOUNTER — Other Ambulatory Visit: Payer: Self-pay | Admitting: Gastroenterology

## 2020-07-01 ENCOUNTER — Encounter: Payer: Medicare PPO | Admitting: Neurology

## 2020-07-07 DIAGNOSIS — H18521 Epithelial (juvenile) corneal dystrophy, right eye: Secondary | ICD-10-CM | POA: Diagnosis not present

## 2020-07-22 NOTE — Progress Notes (Addendum)
Subjective:   Angela Valenzuela is a 69 y.o. female who presents for Medicare Annual (Subsequent) preventive examination.  Review of Systems    N/A  Cardiac Risk Factors include: advanced age (>75men, >15 women);dyslipidemia;hypertension     Objective:    Today's Vitals   07/27/20 1020 07/27/20 1054  BP: (!) 152/82 (!) 162/72  Pulse: (!) 105   Temp: 99.1 F (37.3 C)   TempSrc: Oral   SpO2: 98%   Weight: 172 lb 9.6 oz (78.3 kg)   Height: 5\' 6"  (1.676 m)   PainSc: 0-No pain    Body mass index is 27.86 kg/m.  Advanced Directives 07/27/2020 07/24/2019 06/25/2015  Does Patient Have a Medical Advance Directive? Yes Yes Yes  Type of 08/25/2015 of [place];Living will Healthcare Power of [place];Living will Healthcare Power of [place];Living will  Copy of Healthcare Power of Attorney in Chart? No - copy requested No - copy requested No - copy requested    Current Medications (verified) Outpatient Encounter Medications as of 07/27/2020  Medication Sig  . amLODipine (NORVASC) 5 MG tablet TAKE 1.5 TABLETS (7.5 MG TOTAL) BY MOUTH DAILY.  . calcium carbonate (TUMS EX) 750 MG chewable tablet Chew 1 tablet by mouth daily.  . Calcium-Vitamin D 600-200 MG-UNIT per tablet Take 1 tablet by mouth daily.   . diclofenac Sodium (VOLTAREN) 1 % GEL Apply 1 application topically daily. As needed  . prednisoLONE acetate (PRED FORTE) 1 % ophthalmic suspension Apply 1 drop to eye 3 (three) times daily. Tapering down until December, then d/c  . PREVIDENT 5000 DRY MOUTH 1.1 % GEL dental gel USE AS DIRECTED 1 TIME DAILY  . RA KRILL OIL 500 MG CAPS Take 1 capsule by mouth daily.   . simvastatin (ZOCOR) 20 MG tablet TAKE 1 TABLET BY MOUTH EVERYDAY AT BEDTIME (Patient taking differently: Take 20 mg by mouth at bedtime. )  . ursodiol (ACTIGALL) 500 MG tablet Take 1 tablet (500 mg total) by mouth 3 (three) times daily. **PLEASE SCHEDULE FOLLOW UP APPT**  . loratadine (CLARITIN) 10 MG  tablet Take 1 tablet by mouth daily. (Patient not taking: Reported on 07/27/2020)  . meloxicam (MOBIC) 7.5 MG tablet Take 7.5 mg by mouth daily. (Patient not taking: Reported on 07/27/2020)   No facility-administered encounter medications on file as of 07/27/2020.    Allergies (verified) Patient has no known allergies.   History: Past Medical History:  Diagnosis Date  . Allergy   . Cataract   . Hyperlipidemia   . Hypertension   . Increased liver enzymes 03/17/2015  . Paroxysmal digital cyanosis 03/17/2015   Overview:   a.  Positive anticentromere antibody b.   Sicca-xerostomia c.  RVSP 41 in 2013   . Raynaud's disease 04/30/2015  . Thyroid disease    Past Surgical History:  Procedure Laterality Date  . Phototherapeutic Keratectomy     Family History  Problem Relation Age of Onset  . Hypertension Father   . Stroke Father   . Alzheimer's disease Mother    Social History   Socioeconomic History  . Marital status: Married    Spouse name: Not on file  . Number of children: 1  . Years of education: Not on file  . Highest education level: Some college, no degree  Occupational History  . Occupation: retired  Tobacco Use  . Smoking status: Never Smoker  . Smokeless tobacco: Never Used  Vaping Use  . Vaping Use: Never used  Substance and Sexual Activity  .  Alcohol use: Not Currently    Alcohol/week: 0.0 standard drinks  . Drug use: No  . Sexual activity: Not on file  Other Topics Concern  . Not on file  Social History Narrative  . Not on file   Social Determinants of Health   Financial Resource Strain: Low Risk   . Difficulty of Paying Living Expenses: Not hard at all  Food Insecurity: No Food Insecurity  . Worried About Programme researcher, broadcasting/film/video in the Last Year: Never true  . Ran Out of Food in the Last Year: Never true  Transportation Needs: No Transportation Needs  . Lack of Transportation (Medical): No  . Lack of Transportation (Non-Medical): No  Physical Activity:  Insufficiently Active  . Days of Exercise per Week: 3 days  . Minutes of Exercise per Session: 20 min  Stress: No Stress Concern Present  . Feeling of Stress : Not at all  Social Connections: Moderately Integrated  . Frequency of Communication with Friends and Family: More than three times a week  . Frequency of Social Gatherings with Friends and Family: More than three times a week  . Attends Religious Services: Never  . Active Member of Clubs or Organizations: Yes  . Attends Banker Meetings: More than 4 times per year  . Marital Status: Married    Tobacco Counseling Counseling given: Not Answered   Clinical Intake:  Pre-visit preparation completed: Yes  Pain : No/denies pain Pain Score: 0-No pain     Nutritional Status: BMI 25 -29 Overweight Nutritional Risks: None Diabetes: No  How often do you need to have someone help you when you read instructions, pamphlets, or other written materials from your doctor or pharmacy?: 1 - Never  Diabetic? No  Interpreter Needed?: No  Information entered by :: [facility], LPN   Activities of Daily Living In your present state of health, do you have any difficulty performing the following activities: 07/27/2020  Hearing? Y  Comment Does not wear hearing aids.  Vision? Y  Comment Due to cataracts. Pt to have the removed in 2022.  Difficulty concentrating or making decisions? N  Walking or climbing stairs? N  Dressing or bathing? N  Doing errands, shopping? N  Preparing Food and eating ? N  Using the Toilet? N  In the past six months, have you accidently leaked urine? N  Do you have problems with loss of bowel control? N  Managing your Medications? N  Managing your Finances? N  Housekeeping or managing your Housekeeping? N  Some recent data might be hidden    Patient Care Team: Erasmo Downer, MD as PCP - General (Family Medicine) Kandyce Rud., MD (Rheumatology) Midge Minium, MD as Consulting  Physician (Gastroenterology) System, Provider Not In [place], Ronaldo Miyamoto, MD as Referring Physician (Ophthalmology) Melina Fiddler, MD as Consulting Physician (Sports Medicine) Glendale Chard, DO as Consulting Physician (Neurology)  Indicate any recent Medical Services you may have received from other than [facility] providers in the past year (date may be approximate).     Assessment:   This is a routine wellness examination for [place].  Hearing/Vision screen No exam data present  Dietary issues and exercise activities discussed: Current Exercise Habits: Home exercise routine, Type of exercise: walking, Time (Minutes): 25, Frequency (Times/Week): 3, Weekly Exercise (Minutes/Week): 75, Intensity: Mild, Exercise limited by: orthopedic condition(s)  Goals    . DIET - REDUCE SUGAR INTAKE     Recommend to cut back on sugar in daily  diet to help aid in weight loss.    . Exercise 150 minutes per week (moderate activity)      Depression Screen PHQ 2/9 Scores 07/27/2020 07/24/2019 07/18/2018 07/14/2017 07/01/2016 06/25/2015  PHQ - 2 Score 0 0 0 0 0 0  PHQ- 9 Score - - 0 0 - -    Fall Risk Fall Risk  07/27/2020 07/24/2019 07/24/2019 01/16/2019 07/18/2018  Falls in the past year? 0 0 0 0 No  Number falls in past yr: 0 0 0 - -  Injury with Fall? 0 0 0 - -    Any stairs in or around the home? Yes  If so, are there any without handrails? No  Home free of loose throw rugs in walkways, pet beds, electrical cords, etc? Yes  Adequate lighting in your home to reduce risk of falls? Yes   ASSISTIVE DEVICES UTILIZED TO PREVENT FALLS:  Life alert? No  Use of a cane, walker or w/c? No  Grab bars in the bathroom? No  Shower chair or bench in shower? Yes  Elevated toilet seat or a handicapped toilet? Yes   TIMED UP AND GO:  Was the test performed? Yes .  Length of time to ambulate 10 feet: 10 sec.   Gait steady and fast without use of assistive device  Cognitive Function: Declined today.        6CIT Screen 07/14/2017  What Year? 0 points  What month? 0 points  What time? 0 points  Count back from 20 0 points  Months in reverse 0 points  Repeat phrase 0 points  Total Score 0    Immunizations Immunization History  Administered Date(s) Administered  . Fluad Quad(high Dose 65+) 07/24/2019, 07/27/2020  . Influenza Split 08/08/2012  . Influenza, High Dose Seasonal PF 07/01/2016, 07/14/2017, 07/18/2018  . Influenza,inj,Quad PF,6+ Mos 07/27/2013, 08/12/2014, 06/25/2015  . PFIZER SARS-COV-2 Vaccination 10/29/2019, 11/19/2019  . Pneumococcal Conjugate-13 11/01/2018  . Pneumococcal Polysaccharide-23 07/14/2017  . Tdap 01/11/2008    TDAP status: Due, Education has been provided regarding the importance of this vaccine. Advised may receive this vaccine at local pharmacy or Health Dept. Aware to provide a copy of the vaccination record if obtained from local pharmacy or Health Dept. Verbalized acceptance and understanding. Flu Vaccine status: Completed at today's visit Pneumococcal vaccine status: Up to date Covid-19 vaccine status: Completed vaccines  Qualifies for Shingles Vaccine? Yes   Zostavax completed No   Shingrix Completed?: No.    Education has been provided regarding the importance of this vaccine. Patient has been advised to call insurance company to determine out of pocket expense if they have not yet received this vaccine. Advised may also receive vaccine at local pharmacy or Health Dept. Verbalized acceptance and understanding.  Screening Tests Health Maintenance  Topic Date Due  . TETANUS/TDAP  07/19/2023 (Originally 01/10/2018)  . MAMMOGRAM  01/06/2022  . Fecal DNA (Cologuard)  06/12/2022  . DEXA SCAN  10/10/2022  . INFLUENZA VACCINE  Completed  . COVID-19 Vaccine  Completed  . Hepatitis C Screening  Completed  . PNA vac Low Risk Adult  Completed    Health Maintenance  There are no preventive care reminders to display for this patient.  Colorectal  cancer screening: Cologuard completed 06/13/19. Repeat every 3 years Mammogram status: Completed 5/3/213. Repeat every year Bone Density status: Completed 10/10/17. Results reflect: Bone density results: OSTEOPENIA. Repeat every 5 years.  Lung Cancer Screening: (Low Dose CT Chest recommended if Age 42-80 years, 30 pack-year currently  smoking OR have quit w/in 15years.) does not qualify.   Additional Screening:  Hepatitis C Screening: Up to date  Vision Screening: Recommended annual ophthalmology exams for early detection of glaucoma and other disorders of the eye. Is the patient up to date with their annual eye exam?  Yes  Who is the provider or what is the name of the office in which the patient attends annual eye exams? Dr Beverely Pace If pt is not established with a provider, would they like to be referred to a provider to establish care? No .   Dental Screening: Recommended annual dental exams for proper oral hygiene  Community Resource Referral / Chronic Care Management: CRR required this visit?  No   CCM required this visit?  No      Plan:     I have personally reviewed and noted the following in the patient's chart:   . Medical and social history . Use of alcohol, tobacco or illicit drugs  . Current medications and supplements . Functional ability and status . Nutritional status . Physical activity . Advanced directives . List of other physicians . Hospitalizations, surgeries, and ER visits in previous 12 months . Vitals . Screenings to include cognitive, depression, and falls . Referrals and appointments  In addition, I have reviewed and discussed with patient certain preventive protocols, quality metrics, and best practice recommendations. A written personalized care plan for preventive services as well as general preventive health recommendations were provided to patient.     Devonn Giampietro [place], [place]   [id number]   Nurse Notes: None.

## 2020-07-27 ENCOUNTER — Encounter: Payer: Self-pay | Admitting: Family Medicine

## 2020-07-27 ENCOUNTER — Ambulatory Visit (INDEPENDENT_AMBULATORY_CARE_PROVIDER_SITE_OTHER): Payer: Medicare PPO

## 2020-07-27 ENCOUNTER — Ambulatory Visit (INDEPENDENT_AMBULATORY_CARE_PROVIDER_SITE_OTHER): Payer: Medicare PPO | Admitting: Family Medicine

## 2020-07-27 ENCOUNTER — Other Ambulatory Visit: Payer: Self-pay

## 2020-07-27 VITALS — BP 115/77

## 2020-07-27 VITALS — BP 162/72 | HR 105 | Temp 99.1°F | Ht 66.0 in | Wt 172.6 lb

## 2020-07-27 DIAGNOSIS — Z0000 Encounter for general adult medical examination without abnormal findings: Secondary | ICD-10-CM | POA: Diagnosis not present

## 2020-07-27 DIAGNOSIS — E038 Other specified hypothyroidism: Secondary | ICD-10-CM | POA: Diagnosis not present

## 2020-07-27 DIAGNOSIS — K743 Primary biliary cirrhosis: Secondary | ICD-10-CM | POA: Diagnosis not present

## 2020-07-27 DIAGNOSIS — E782 Mixed hyperlipidemia: Secondary | ICD-10-CM | POA: Diagnosis not present

## 2020-07-27 DIAGNOSIS — M349 Systemic sclerosis, unspecified: Secondary | ICD-10-CM

## 2020-07-27 DIAGNOSIS — Z23 Encounter for immunization: Secondary | ICD-10-CM

## 2020-07-27 DIAGNOSIS — I10 Essential (primary) hypertension: Secondary | ICD-10-CM | POA: Diagnosis not present

## 2020-07-27 NOTE — Assessment & Plan Note (Signed)
LFTs stable previously Continue Ursodiol at current dose Recheck LFTs

## 2020-07-27 NOTE — Assessment & Plan Note (Signed)
Previously well controlled Continue statin Repeat FLP and CMP  

## 2020-07-27 NOTE — Patient Instructions (Signed)
Angela Valenzuela , Thank you for taking time to come for your Medicare Wellness Visit. I appreciate your ongoing commitment to your health goals. Please review the following plan we discussed and let me know if I can assist you in the future.   Screening recommendations/referrals: Colonoscopy: Cologuard up to date, due 05/2022 Mammogram: Up to date, due 01/2021 Bone Density: Up to date, due 09/2022 Recommended yearly ophthalmology/optometry visit for glaucoma screening and checkup Recommended yearly dental visit for hygiene and checkup  Vaccinations: Influenza vaccine: Administered today.  Pneumococcal vaccine: Completed series Tdap vaccine: Currently due, declined at this time.  Shingles vaccine: Shingrix discussed. Please contact your pharmacy for coverage information.     Advanced directives: Please bring a copy of your POA (Power of Attorney) and/or Living Will to your next appointment.   Conditions/risks identified: Recommend to continue to cut back on sugar intake and increase exercise to 150 minutes a week to aid with weight loss.   Next appointment: 11:00 AM today with Dr Beryle Flock    Preventive Care 69 Years and Older, Female Preventive care refers to lifestyle choices and visits with your health care provider that can promote health and wellness. What does preventive care include?  A yearly physical exam. This is also called an annual well check.  Dental exams once or twice a year.  Routine eye exams. Ask your health care provider how often you should have your eyes checked.  Personal lifestyle choices, including:  Daily care of your teeth and gums.  Regular physical activity.  Eating a healthy diet.  Avoiding tobacco and drug use.  Limiting alcohol use.  Practicing safe sex.  Taking low-dose aspirin every day.  Taking vitamin and mineral supplements as recommended by your health care provider. What happens during an annual well check? The services and screenings  done by your health care provider during your annual well check will depend on your age, overall health, lifestyle risk factors, and family history of disease. Counseling  Your health care provider may ask you questions about your:  Alcohol use.  Tobacco use.  Drug use.  Emotional well-being.  Home and relationship well-being.  Sexual activity.  Eating habits.  History of falls.  Memory and ability to understand (cognition).  Work and work Astronomer.  Reproductive health. Screening  You may have the following tests or measurements:  Height, weight, and BMI.  Blood pressure.  Lipid and cholesterol levels. These may be checked every 5 years, or more frequently if you are over 69 years old.  Skin check.  Lung cancer screening. You may have this screening every year starting at age 16 if you have a 30-pack-year history of smoking and currently smoke or have quit within the past 15 years.  Fecal occult blood test (FOBT) of the stool. You may have this test every year starting at age 17.  Flexible sigmoidoscopy or colonoscopy. You may have a sigmoidoscopy every 5 years or a colonoscopy every 10 years starting at age 4.  Hepatitis C blood test.  Hepatitis B blood test.  Sexually transmitted disease (STD) testing.  Diabetes screening. This is done by checking your blood sugar (glucose) after you have not eaten for a while (fasting). You may have this done every 1-3 years.  Bone density scan. This is done to screen for osteoporosis. You may have this done starting at age 40.  Mammogram. This may be done every 1-2 years. Talk to your health care provider about how often you should have regular  mammograms. Talk with your health care provider about your test results, treatment options, and if necessary, the need for more tests. Vaccines  Your health care provider may recommend certain vaccines, such as:  Influenza vaccine. This is recommended every year.  Tetanus,  diphtheria, and acellular pertussis (Tdap, Td) vaccine. You may need a Td booster every 10 years.  Zoster vaccine. You may need this after age 69.  Pneumococcal 13-valent conjugate (PCV13) vaccine. One dose is recommended after age 69.  Pneumococcal polysaccharide (PPSV23) vaccine. One dose is recommended after age 69. Talk to your health care provider about which screenings and vaccines you need and how often you need them. This information is not intended to replace advice given to you by your health care provider. Make sure you discuss any questions you have with your health care provider. Document Released: 10/02/2015 Document Revised: 05/25/2016 Document Reviewed: 07/07/2015 Elsevier Interactive Patient Education  2017 [place] Prevention in the Home Falls can cause injuries. They can happen to people of all ages. There are many things you can do to make your home safe and to help prevent falls. What can I do on the outside of my home?  Regularly fix the edges of walkways and driveways and fix any cracks.  Remove anything that might make you trip as you walk through a door, such as a raised step or threshold.  Trim any bushes or trees on the path to your home.  Use bright outdoor lighting.  Clear any walking paths of anything that might make someone trip, such as rocks or tools.  Regularly check to see if handrails are loose or broken. Make sure that both sides of any steps have handrails.  Any raised decks and porches should have guardrails on the edges.  Have any leaves, snow, or ice cleared regularly.  Use sand or salt on walking paths during winter.  Clean up any spills in your garage right away. This includes oil or grease spills. What can I do in the bathroom?  Use night lights.  Install grab bars by the toilet and in the tub and shower. Do not use towel bars as grab bars.  Use non-skid mats or decals in the tub or shower.  If you need to sit down in  the shower, use a plastic, non-slip stool.  Keep the floor dry. Clean up any water that spills on the floor as soon as it happens.  Remove soap buildup in the tub or shower regularly.  Attach bath mats securely with double-sided non-slip rug tape.  Do not have throw rugs and other things on the floor that can make you trip. What can I do in the bedroom?  Use night lights.  Make sure that you have a light by your bed that is easy to reach.  Do not use any sheets or blankets that are too big for your bed. They should not hang down onto the floor.  Have a firm chair that has side arms. You can use this for support while you get dressed.  Do not have throw rugs and other things on the floor that can make you trip. What can I do in the kitchen?  Clean up any spills right away.  Avoid walking on wet floors.  Keep items that you use a lot in easy-to-reach places.  If you need to reach something above you, use a strong step stool that has a grab bar.  Keep electrical cords out of the way.  Do not use floor polish or wax that makes floors slippery. If you must use wax, use non-skid floor wax.  Do not have throw rugs and other things on the floor that can make you trip. What can I do with my stairs?  Do not leave any items on the stairs.  Make sure that there are handrails on both sides of the stairs and use them. Fix handrails that are broken or loose. Make sure that handrails are as long as the stairways.  Check any carpeting to make sure that it is firmly attached to the stairs. Fix any carpet that is loose or worn.  Avoid having throw rugs at the top or bottom of the stairs. If you do have throw rugs, attach them to the floor with carpet tape.  Make sure that you have a light switch at the top of the stairs and the bottom of the stairs. If you do not have them, ask someone to add them for you. What else can I do to help prevent falls?  Wear shoes that:  Do not have high  heels.  Have rubber bottoms.  Are comfortable and fit you well.  Are closed at the toe. Do not wear sandals.  If you use a stepladder:  Make sure that it is fully opened. Do not climb a closed stepladder.  Make sure that both sides of the stepladder are locked into place.  Ask someone to hold it for you, if possible.  Clearly mark and make sure that you can see:  Any grab bars or handrails.  First and last steps.  Where the edge of each step is.  Use tools that help you move around (mobility aids) if they are needed. These include:  Canes.  Walkers.  Scooters.  Crutches.  Turn on the lights when you go into a dark area. Replace any light bulbs as soon as they burn out.  Set up your furniture so you have a clear path. Avoid moving your furniture around.  If any of your floors are uneven, fix them.  If there are any pets around you, be aware of where they are.  Review your medicines with your doctor. Some medicines can make you feel dizzy. This can increase your chance of falling. Ask your doctor what other things that you can do to help prevent falls. This information is not intended to replace advice given to you by your health care provider. Make sure you discuss any questions you have with your health care provider. Document Released: 07/02/2009 Document Revised: 02/11/2016 Document Reviewed: 10/10/2014 Elsevier Interactive Patient Education  2017 [employer].

## 2020-07-27 NOTE — Assessment & Plan Note (Signed)
Asymptomatic Not on medications Recheck TSH and T4

## 2020-07-27 NOTE — Assessment & Plan Note (Signed)
Well controlled Continue current medications Recheck metabolic panel F/u in 6 months  

## 2020-07-27 NOTE — Assessment & Plan Note (Signed)
Followed by Rheum

## 2020-07-27 NOTE — Patient Instructions (Addendum)
The CDC recommends two doses of Shingrix (the shingles vaccine) separated by 2 to 6 months for adults age 69 years and older. I recommend checking with your insurance plan regarding coverage for this vaccine.     Preventive Care 88 Years and Older, Female Preventive care refers to lifestyle choices and visits with your health care provider that can promote health and wellness. This includes:  A yearly physical exam. This is also called an annual well check.  Regular dental and eye exams.  Immunizations.  Screening for certain conditions.  Healthy lifestyle choices, such as diet and exercise. What can I expect for my preventive care visit? Physical exam Your health care provider will check:  Height and weight. These may be used to calculate body mass index (BMI), which is a measurement that tells if you are at a healthy weight.  Heart rate and blood pressure.  Your skin for abnormal spots. Counseling Your health care provider may ask you questions about:  Alcohol, tobacco, and drug use.  Emotional well-being.  Home and relationship well-being.  Sexual activity.  Eating habits.  History of falls.  Memory and ability to understand (cognition).  Work and work Statistician.  Pregnancy and menstrual history. What immunizations do I need?  Influenza (flu) vaccine  This is recommended every year. Tetanus, diphtheria, and pertussis (Tdap) vaccine  You may need a Td booster every 10 years. Varicella (chickenpox) vaccine  You may need this vaccine if you have not already been vaccinated. Zoster (shingles) vaccine  You may need this after age 2. Pneumococcal conjugate (PCV13) vaccine  One dose is recommended after age 59. Pneumococcal polysaccharide (PPSV23) vaccine  One dose is recommended after age 56. Measles, mumps, and rubella (MMR) vaccine  You may need at least one dose of MMR if you were born in 1957 or later. You may also need a second  dose. Meningococcal conjugate (MenACWY) vaccine  You may need this if you have certain conditions. Hepatitis A vaccine  You may need this if you have certain conditions or if you travel or work in places where you may be exposed to hepatitis A. Hepatitis B vaccine  You may need this if you have certain conditions or if you travel or work in places where you may be exposed to hepatitis B. Haemophilus influenzae type b (Hib) vaccine  You may need this if you have certain conditions. You may receive vaccines as individual doses or as more than one vaccine together in one shot (combination vaccines). Talk with your health care provider about the risks and benefits of combination vaccines. What tests do I need? Blood tests  Lipid and cholesterol levels. These may be checked every 5 years, or more frequently depending on your overall health.  Hepatitis C test.  Hepatitis B test. Screening  Lung cancer screening. You may have this screening every year starting at age 41 if you have a 30-pack-year history of smoking and currently smoke or have quit within the past 15 years.  Colorectal cancer screening. All adults should have this screening starting at age 42 and continuing until age 56. Your health care provider may recommend screening at age 34 if you are at increased risk. You will have tests every 1-10 years, depending on your results and the type of screening test.  Diabetes screening. This is done by checking your blood sugar (glucose) after you have not eaten for a while (fasting). You may have this done every 1-3 years.  Mammogram. This may  be done every 1-2 years. Talk with your health care provider about how often you should have regular mammograms.  BRCA-related cancer screening. This may be done if you have a family history of breast, ovarian, tubal, or peritoneal cancers. Other tests  Sexually transmitted disease (STD) testing.  Bone density scan. This is done to screen for  osteoporosis. You may have this done starting at age 20. Follow these instructions at home: Eating and drinking  Eat a diet that includes fresh fruits and vegetables, whole grains, lean protein, and low-fat dairy products. Limit your intake of foods with high amounts of sugar, saturated fats, and salt.  Take vitamin and mineral supplements as recommended by your health care provider.  Do not drink alcohol if your health care provider tells you not to drink.  If you drink alcohol: ? Limit how much you have to 0-1 drink a day. ? Be aware of how much alcohol is in your drink. In the [place] one drink equals one 12 oz bottle of beer (355 mL), one 5 oz glass of wine (148 mL), or one 1 oz glass of hard liquor (44 mL). Lifestyle  Take daily care of your teeth and gums.  Stay active. Exercise for at least 30 minutes on 5 or more days each week.  Do not use any products that contain nicotine or tobacco, such as cigarettes, e-cigarettes, and chewing tobacco. If you need help quitting, ask your health care provider.  If you are sexually active, practice safe sex. Use a condom or other form of protection in order to prevent STIs (sexually transmitted infections).  Talk with your health care provider about taking a low-dose aspirin or statin. What's next?  Go to your health care provider once a year for a well check visit.  Ask your health care provider how often you should have your eyes and teeth checked.  Stay up to date on all vaccines. This information is not intended to replace advice given to you by your health care provider. Make sure you discuss any questions you have with your health care provider. Document Revised: 08/30/2018 Document Reviewed: 08/30/2018 Elsevier Patient Education  [place].  Influenza (Flu) Vaccine (Inactivated or Recombinant): What You Need to Know 1. Why get vaccinated? Influenza vaccine can prevent influenza (flu). Flu is a contagious disease that  spreads around the [place] every year, usually between October and May. Anyone can get the flu, but it is more dangerous for some people. Infants and young children, people 69 years of age and older, pregnant women, and people with certain health conditions or a weakened immune system are at greatest risk of flu complications. Pneumonia, bronchitis, sinus infections and ear infections are examples of flu-related complications. If you have a medical condition, such as heart disease, cancer or diabetes, flu can make it worse. Flu can cause fever and chills, sore throat, muscle aches, fatigue, cough, headache, and runny or stuffy nose. Some people may have vomiting and diarrhea, though this is more common in children than adults. Each year thousands of people in the [place] die from flu, and many more are hospitalized. Flu vaccine prevents millions of illnesses and flu-related visits to the doctor each year. 2. Influenza vaccine CDC recommends everyone 82 months of age and older get vaccinated every flu season. Children 6 months through 18 years of age may need 2 doses during a single flu season. Everyone else needs only 1 dose each flu season. It takes about 2 weeks  for protection to develop after vaccination. There are many flu viruses, and they are always changing. Each year a new flu vaccine is made to protect against three or four viruses that are likely to cause disease in the upcoming flu season. Even when the vaccine doesn't exactly match these viruses, it may still provide some protection. Influenza vaccine does not cause flu. Influenza vaccine may be given at the same time as other vaccines. 3. Talk with your health care provider Tell your vaccine provider if the person getting the vaccine:  Has had an allergic reaction after a previous dose of influenza vaccine, or has any severe, life-threatening allergies.  Has ever had Guillain-Barr Syndrome (also called GBS). In some cases,  your health care provider may decide to postpone influenza vaccination to a future visit. People with minor illnesses, such as a cold, may be vaccinated. People who are moderately or severely ill should usually wait until they recover before getting influenza vaccine. Your health care provider can give you more information. 4. Risks of a vaccine reaction  Soreness, redness, and swelling where shot is given, fever, muscle aches, and headache can happen after influenza vaccine.  There may be a very small increased risk of Guillain-Barr Syndrome (GBS) after inactivated influenza vaccine (the flu shot). Young children who get the flu shot along with pneumococcal vaccine (PCV13), and/or DTaP vaccine at the same time might be slightly more likely to have a seizure caused by fever. Tell your health care provider if a child who is getting flu vaccine has ever had a seizure. People sometimes faint after medical procedures, including vaccination. Tell your provider if you feel dizzy or have vision changes or ringing in the ears. As with any medicine, there is a very remote chance of a vaccine causing a severe allergic reaction, other serious injury, or death. 5. What if there is a serious problem? An allergic reaction could occur after the vaccinated person leaves the clinic. If you see signs of a severe allergic reaction (hives, swelling of the face and throat, difficulty breathing, a fast heartbeat, dizziness, or weakness), call 9-1-1 and get the person to the nearest hospital. For other signs that concern you, call your health care provider. Adverse reactions should be reported to the Vaccine Adverse Event Reporting System (VAERS). Your health care provider will usually file this report, or you can do it yourself. Visit the VAERS website at www.vaers.SamedayNews.es or call 531-537-1242.VAERS is only for reporting reactions, and VAERS staff do not give medical advice. 6. The National Vaccine Injury Compensation  Program The [employer] Vaccine Injury Compensation Program (VICP) is a federal program that was created to compensate people who may have been injured by certain vaccines. Visit the VICP website at GoldCloset.com.ee or call 5671073425 to learn about the program and about filing a claim. There is a time limit to file a claim for compensation. 7. How can I learn more?  Ask your healthcare provider.  Call your local or state health department.  Contact the Centers for Disease Control and Prevention (CDC): ? Call (704)051-7386 (1-800-CDC-INFO) or ? Visit CDC's https://gibson.com/ Vaccine Information Statement (Interim) Inactivated Influenza Vaccine (05/03/2018) This information is not intended to replace advice given to you by your health care provider. Make sure you discuss any questions you have with your health care provider. Document Revised: 12/25/2018 Document Reviewed: 05/07/2018 Elsevier Patient Education  [place].

## 2020-07-27 NOTE — Progress Notes (Signed)
Complete physical exam   Patient: Angela Valenzuela   DOB: March 30, 1951   69 y.o. Female  MRN: [id number] Visit Date: 07/27/2020  Today's healthcare provider: [name], MD   Chief Complaint  Patient presents with  . Annual Exam   Subjective    Angela Valenzuela is a 69 y.o. female who presents today for a complete physical exam.  She reports consuming a general diet. Home exercise routine includes stretching. She generally feels well. She reports sleeping well. She does not have additional problems to discuss today.  HPI  07/27/2020 AWV today 01/20/2020 Mammogram-BI-RADS 2 10/10/2017 BMD-Osteopenia 06/13/2019 FIT-Negative 04/22/2008 Colonoscopy-normal  Past Medical History:  Diagnosis Date  . Allergy   . Cataract   . Hyperlipidemia   . Hypertension   . Increased liver enzymes 03/17/2015  . Paroxysmal digital cyanosis 03/17/2015   Overview:   a.  Positive anticentromere antibody b.   Sicca-xerostomia c.  RVSP 41 in 2013   . Raynaud's disease 04/30/2015  . Thyroid disease    Past Surgical History:  Procedure Laterality Date  . EYE SURGERY    . Phototherapeutic Keratectomy     Social History   Socioeconomic History  . Marital status: Married    Spouse name: Not on file  . Number of children: 1  . Years of education: Not on file  . Highest education level: Some college, no degree  Occupational History  . Occupation: retired  Tobacco Use  . Smoking status: Never Smoker  . Smokeless tobacco: Never Used  Vaping Use  . Vaping Use: Never used  Substance and Sexual Activity  . Alcohol use: Not Currently    Alcohol/week: 0.0 standard drinks  . Drug use: No  . Sexual activity: Not on file  Other Topics Concern  . Not on file  Social History Narrative  . Not on file   Social Determinants of Health   Financial Resource Strain: Low Risk   . Difficulty of Paying Living Expenses: Not hard at all  Food Insecurity: No Food Insecurity  . Worried About Patent examiner in the Last Year: Never true  . Ran Out of Food in the Last Year: Never true  Transportation Needs: No Transportation Needs  . Lack of Transportation (Medical): No  . Lack of Transportation (Non-Medical): No  Physical Activity: Insufficiently Active  . Days of Exercise per Week: 3 days  . Minutes of Exercise per Session: 20 min  Stress: No Stress Concern Present  . Feeling of Stress : Not at all  Social Connections: Moderately Integrated  . Frequency of Communication with Friends and Family: More than three times a week  . Frequency of Social Gatherings with Friends and Family: More than three times a week  . Attends Religious Services: Never  . Active Member of Clubs or Organizations: Yes  . Attends Banker Meetings: More than 4 times per year  . Marital Status: Married  Catering manager Violence: Not At Risk  . Fear of Current or Ex-Partner: No  . Emotionally Abused: No  . Physically Abused: No  . Sexually Abused: No   Family Status  Relation Name Status  . Father  Deceased at age 40       HTN  . Mother  Deceased at age 64       Alzheimer's disease  . Sister  Alive  . Brother  Alive   Family History  Problem Relation Age of Onset  . Hypertension Father   .  Stroke Father   . Alzheimer's disease Mother    No Known Allergies  Patient Care Team: Manmeet Arzola, Marzella SchleinAngela M, MD as PCP - General (Family Medicine) Kandyce RudKernodle, George W Jr., MD (Rheumatology) Midge MiniumWohl, Darren, MD as Consulting Physician (Gastroenterology) System, Provider [place] In [place], Ronaldo MiyamotoMelissa Beth, MD as Referring Physician (Ophthalmology) Melina FiddlerBassett, Rebecca S, MD as Consulting Physician (Sports Medicine) Glendale ChardPatel, Donika K, DO as Consulting Physician (Neurology)   Medications: Outpatient Medications Prior to Visit  Medication Sig  . amLODipine (NORVASC) 5 MG tablet TAKE 1.5 TABLETS (7.5 MG TOTAL) BY MOUTH DAILY.  . calcium carbonate (TUMS EX) 750 MG chewable tablet Chew 1 tablet by mouth daily.  .  Calcium-Vitamin D 600-200 MG-UNIT per tablet Take 1 tablet by mouth daily.   . diclofenac Sodium (VOLTAREN) 1 % GEL Apply 1 application topically daily. As needed  . prednisoLONE acetate (PRED FORTE) 1 % ophthalmic suspension Apply 1 drop to eye 3 (three) times daily. Tapering down until December, then d/c  . PREVIDENT 5000 DRY MOUTH 1.1 % GEL dental gel USE AS DIRECTED 1 TIME DAILY  . RA KRILL OIL 500 MG CAPS Take 1 capsule by mouth daily.   . simvastatin (ZOCOR) 20 MG tablet TAKE 1 TABLET BY MOUTH EVERYDAY AT BEDTIME (Patient taking differently: Take 20 mg by mouth at bedtime. )  . ursodiol (ACTIGALL) 500 MG tablet Take 1 tablet (500 mg total) by mouth 3 (three) times daily. **PLEASE SCHEDULE FOLLOW UP APPT**  . [DISCONTINUED] loratadine (CLARITIN) 10 MG tablet Take 1 tablet by mouth daily. (Patient not taking: Reported on 07/27/2020)  . [DISCONTINUED] meloxicam (MOBIC) 7.5 MG tablet Take 7.5 mg by mouth daily. (Patient not taking: Reported on 07/27/2020)   No facility-administered medications prior to visit.    Review of Systems  Constitutional: Negative.   HENT: Negative.   Eyes: Negative.   Respiratory: Negative.   Cardiovascular: Negative.   Gastrointestinal: Negative.   Endocrine: Negative.   Genitourinary: Negative.   Musculoskeletal: Negative.   Skin: Negative.   Allergic/Immunologic: Negative.   Neurological: Negative.   Hematological: Negative.   Psychiatric/Behavioral: Negative.     Last CBC Lab Results  Component Value Date   WBC 4.9 07/26/2019   HGB 14.1 07/26/2019   HCT 43.4 07/26/2019   MCV 88 07/26/2019   MCH 28.5 07/26/2019   RDW 12.7 07/26/2019   PLT 221 07/26/2019   Last metabolic panel Lab Results  Component Value Date   GLUCOSE 102 (H) 01/31/2020   NA 139 01/31/2020   K 3.8 01/31/2020   CL 101 01/31/2020   CO2 23 01/31/2020   BUN 13 01/31/2020   CREATININE 0.78 01/31/2020   GFRNONAA 78 01/31/2020   GFRAA 90 01/31/2020   CALCIUM 9.7 01/31/2020    PROT 6.6 01/31/2020   ALBUMIN 4.6 01/31/2020   LABGLOB 2.0 01/31/2020   AGRATIO 2.3 (H) 01/31/2020   BILITOT 0.4 01/31/2020   ALKPHOS 114 01/31/2020   AST 19 01/31/2020   ALT 16 01/31/2020   Last lipids Lab Results  Component Value Date   CHOL 175 07/26/2019   HDL 91 07/26/2019   LDLCALC 71 07/26/2019   TRIG 66 07/26/2019   CHOLHDL 2.2 03/27/2018   Last hemoglobin A1c No results found for: HGBA1C Last thyroid functions Lab Results  Component Value Date   TSH 2.260 01/31/2020   Last vitamin D No results found for: 25OHVITD2, 25OHVITD3, VD25OH Last vitamin B12 and Folate Lab Results  Component Value Date   VITAMINB12 261 07/14/2017  Objective    BP 115/77 Comment: home BP BP Readings from Last 3 Encounters:  07/27/20 115/77  07/27/20 (!) 162/72  01/31/20 115/72   Wt Readings from Last 3 Encounters:  07/27/20 172 lb 9.6 oz (78.3 kg)  01/31/20 174 lb 3.2 oz (79 kg)  08/22/19 176 lb 6.4 oz (80 kg)      Physical Exam Vitals reviewed.  Constitutional:      General: She is not in acute distress.    Appearance: Normal appearance. She is well-developed. She is not diaphoretic.  HENT:     Head: Normocephalic and atraumatic.     Right Ear: Tympanic membrane, ear canal and external ear normal.     Left Ear: Tympanic membrane, ear canal and external ear normal.  Eyes:     General: No scleral icterus.    Conjunctiva/sclera: Conjunctivae normal.     Pupils: Pupils are equal, round, and reactive to light.  Neck:     Thyroid: No thyromegaly.  Cardiovascular:     Rate and Rhythm: Normal rate and regular rhythm.     Pulses: Normal pulses.     Heart sounds: Normal heart sounds. No murmur heard.   Pulmonary:     Effort: Pulmonary effort is normal. No respiratory distress.     Breath sounds: Normal breath sounds. No wheezing or rales.  Abdominal:     General: There is no distension.     Palpations: Abdomen is soft.     Tenderness: There is no abdominal  tenderness.  Musculoskeletal:        General: No deformity.     Cervical back: Neck supple.     Right lower leg: No edema.     Left lower leg: No edema.  Lymphadenopathy:     Cervical: No cervical adenopathy.  Skin:    General: Skin is warm and dry.     Findings: No rash.  Neurological:     Mental Status: She is alert and oriented to person, place, and time. Mental status is at baseline.     Gait: Gait normal.  Psychiatric:        Mood and Affect: Mood normal.        Behavior: Behavior normal.        Thought Content: Thought content normal.      Last depression screening scores PHQ 2/9 Scores 07/27/2020 07/24/2019 07/18/2018  PHQ - 2 Score 0 0 0  PHQ- 9 Score - - 0   Last fall risk screening Fall Risk  07/27/2020  Falls in the past year? 0  Number falls in past yr: 0  Injury with Fall? 0   Last Audit-C alcohol use screening Alcohol Use Disorder Test (AUDIT) 07/27/2020  1. How often do you have a drink containing alcohol? 0  2. How many drinks containing alcohol do you have on a typical day when you are drinking? 0  3. How often do you have six or more drinks on one occasion? 0  AUDIT-C Score 0  Alcohol Brief Interventions/Follow-up AUDIT Score <7 follow-up not indicated   A score of 3 or more in women, and 4 or more in men indicates increased risk for alcohol abuse, EXCEPT if all of the points are from question 1   No results found for any visits on 07/27/20.  Assessment & Plan    Routine Health Maintenance and Physical Exam  Exercise Activities and Dietary recommendations Goals    . DIET - REDUCE SUGAR INTAKE     Recommend  to cut back on sugar in daily diet to help aid in weight loss.    . Exercise 150 minutes per week (moderate activity)       Immunization History  Administered Date(s) Administered  . Fluad Quad(high Dose 65+) 07/24/2019, 07/27/2020  . Influenza Split 08/08/2012  . Influenza, High Dose Seasonal PF 07/01/2016, 07/14/2017, 07/18/2018  .  Influenza,inj,Quad PF,6+ Mos 07/27/2013, 08/12/2014, 06/25/2015  . PFIZER SARS-COV-2 Vaccination 10/29/2019, 11/19/2019  . Pneumococcal Conjugate-13 11/01/2018  . Pneumococcal Polysaccharide-23 07/14/2017  . Tdap 01/11/2008    Health Maintenance  Topic Date Due  . TETANUS/TDAP  07/19/2023 (Originally 01/10/2018)  . MAMMOGRAM  01/06/2022  . Fecal DNA (Cologuard)  06/12/2022  . DEXA SCAN  10/10/2022  . INFLUENZA VACCINE  Completed  . COVID-19 Vaccine  Completed  . Hepatitis C Screening  Completed  . PNA vac Low Risk Adult  Completed    Discussed health benefits of physical activity, and encouraged her to engage in regular exercise appropriate for her age and condition.  Problem List Items Addressed This Visit      Cardiovascular and Mediastinum   Hypertension    Well controlled Continue current medications Recheck metabolic panel F/u in 6 months         Digestive   Primary biliary cholangitis (HCC)    LFTs stable previously Continue Ursodiol at current dose Recheck LFTs        Endocrine   Subclinical hypothyroidism    Asymptomatic Not on medications Recheck TSH and T4      Relevant Orders   TSH     Musculoskeletal and Integument   Buschke's scleredema (HCC)    Followed by Rheum        Other   Hyperlipidemia    Previously well controlled Continue statin Repeat FLP and CMP      Relevant Orders   Lipid Panel With LDL/HDL Ratio    Other Visit Diagnoses    Encounter for annual physical exam    -  Primary   Relevant Orders   Comprehensive metabolic panel   Lipid Panel With LDL/HDL Ratio   TSH   Essential hypertension       Relevant Orders   Comprehensive metabolic panel   Need for influenza vaccination           Return in about 6 months (around 01/24/2021) for chronic disease f/u.     I, [name], MD, have reviewed all documentation for this visit. The documentation on 07/27/20 for the exam, diagnosis, procedures, and orders are all  accurate and complete.   Mickey Hebel, Marzella Schlein, MD, MPH [facility]

## 2020-07-31 DIAGNOSIS — E038 Other specified hypothyroidism: Secondary | ICD-10-CM | POA: Diagnosis not present

## 2020-07-31 DIAGNOSIS — E782 Mixed hyperlipidemia: Secondary | ICD-10-CM | POA: Diagnosis not present

## 2020-07-31 DIAGNOSIS — I10 Essential (primary) hypertension: Secondary | ICD-10-CM | POA: Diagnosis not present

## 2020-07-31 DIAGNOSIS — Z0000 Encounter for general adult medical examination without abnormal findings: Secondary | ICD-10-CM | POA: Diagnosis not present

## 2020-08-01 LAB — TSH: TSH: 3.4 u[IU]/mL (ref 0.450–4.500)

## 2020-08-01 LAB — LIPID PANEL WITH LDL/HDL RATIO
Cholesterol, Total: 182 mg/dL (ref 100–199)
HDL: 94 mg/dL (ref >39)
LDL Chol Calc (NIH): 76 mg/dL (ref 0–99)
LDL/HDL Ratio: 0.8 ratio (ref 0.0–3.2)
Triglycerides: 61 mg/dL (ref 0–149)
VLDL Cholesterol Cal: 12 mg/dL (ref 5–40)

## 2020-08-01 LAB — COMPREHENSIVE METABOLIC PANEL
ALT: 14 IU/L (ref 0–32)
AST: 19 IU/L (ref 0–40)
Albumin/Globulin Ratio: 2.4 — ABNORMAL HIGH (ref 1.2–2.2)
Albumin: 4.5 g/dL (ref 3.8–4.8)
Alkaline Phosphatase: 107 IU/L (ref 44–121)
BUN/Creatinine Ratio: 17 (ref 12–28)
BUN: 14 mg/dL (ref 8–27)
Bilirubin Total: 0.3 mg/dL (ref 0.0–1.2)
CO2: 26 mmol/L (ref 20–29)
Calcium: 9.5 mg/dL (ref 8.7–10.3)
Chloride: 104 mmol/L (ref 96–106)
Creatinine, Ser: 0.81 mg/dL (ref 0.57–1.00)
GFR calc Af Amer: 86 mL/min/{1.73_m2} (ref >59)
GFR calc non Af Amer: 74 mL/min/{1.73_m2} (ref >59)
Globulin, Total: 1.9 g/dL (ref 1.5–4.5)
Glucose: 101 mg/dL — ABNORMAL HIGH (ref 65–99)
Potassium: 4.2 mmol/L (ref 3.5–5.2)
Sodium: 141 mmol/L (ref 134–144)
Total Protein: 6.4 g/dL (ref 6.0–8.5)

## 2020-08-11 ENCOUNTER — Encounter: Payer: Medicare PPO | Admitting: Neurology

## 2020-08-27 DIAGNOSIS — H269 Unspecified cataract: Secondary | ICD-10-CM | POA: Diagnosis not present

## 2020-09-05 ENCOUNTER — Other Ambulatory Visit: Payer: Self-pay | Admitting: Family Medicine

## 2020-09-05 NOTE — Telephone Encounter (Signed)
Requested Prescriptions  Pending Prescriptions Disp Refills  . amLODipine (NORVASC) 5 MG tablet [Pharmacy Med Name: AMLODIPINE BESYLATE 5 MG TAB] 135 tablet 1    Sig: TAKE 1.5 TABLETS (7.5 MG TOTAL) BY MOUTH DAILY.     Cardiovascular:  Calcium Channel Blockers Passed - 09/05/2020  8:50 AM      Passed - Last BP in normal range    BP Readings from Last 1 Encounters:  07/27/20 115/77         Passed - Valid encounter within last 6 months    Recent Outpatient Visits          1 month ago Encounter for annual physical exam   [facility] [place], Marzella Schlein, MD   7 months ago Essential hypertension   [facility] [place], Marzella Schlein, MD   1 year ago Encounter for annual physical exam   [facility] [place], Marzella Schlein, MD   1 year ago Essential hypertension   [facility] [place], Marzella Schlein, MD   1 year ago Essential hypertension   [facility] [place], Marzella Schlein, MD      Future Appointments            In 4 months Bac[place]alupo, Marzella Schlein, MD [facility]

## 2020-09-18 ENCOUNTER — Other Ambulatory Visit: Payer: Self-pay | Admitting: Family Medicine

## 2020-09-18 IMAGING — MG MM DIGITAL DIAGNOSTIC UNILAT*R* W/ TOMO W/ CAD
4 series · 4 of 12 positions shown · non-contrast
Comparison: Previous exam(s).

CLINICAL DATA: Possible mass in the outer right breast on a recent
screening mammogram.

EXAM:
DIGITAL DIAGNOSTIC RIGHT MAMMOGRAM WITH TOMO
ULTRASOUND RIGHT BREAST

[R CC synth-2D]
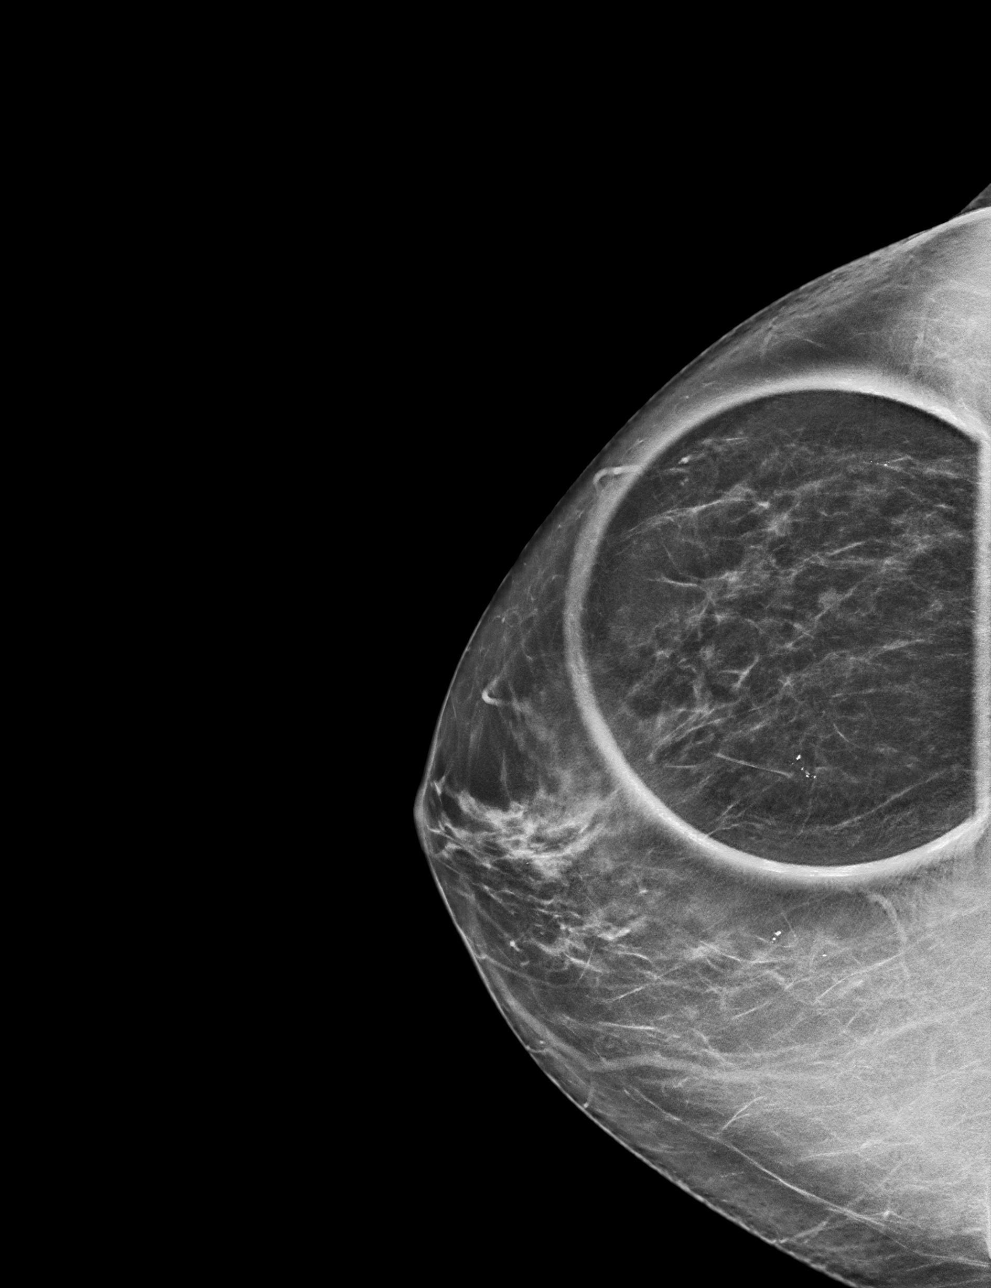

[R MLO synth-2D]
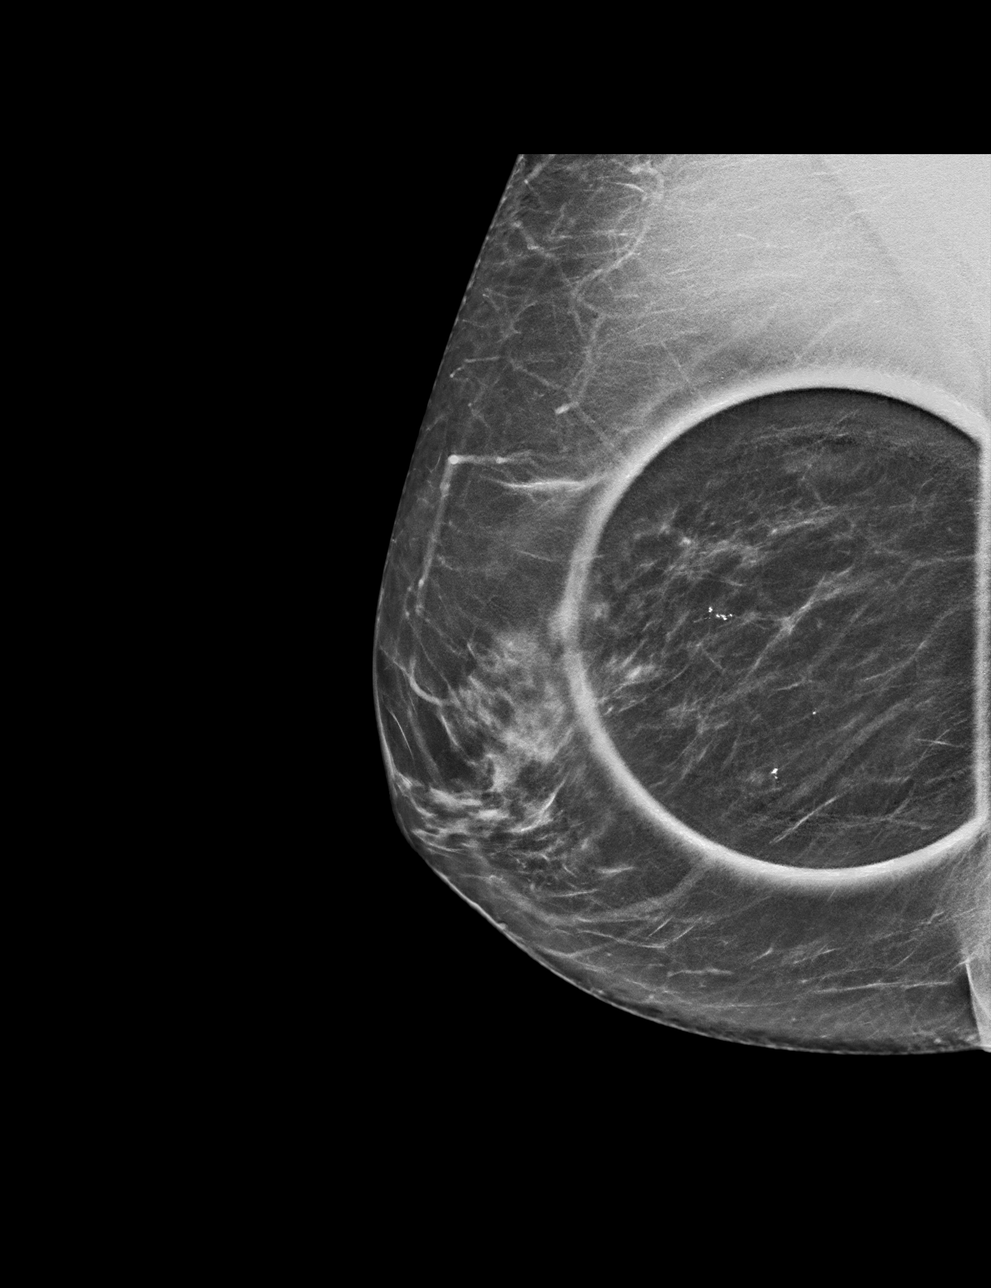

[R CC tomo · tomo slice 31/61.0]
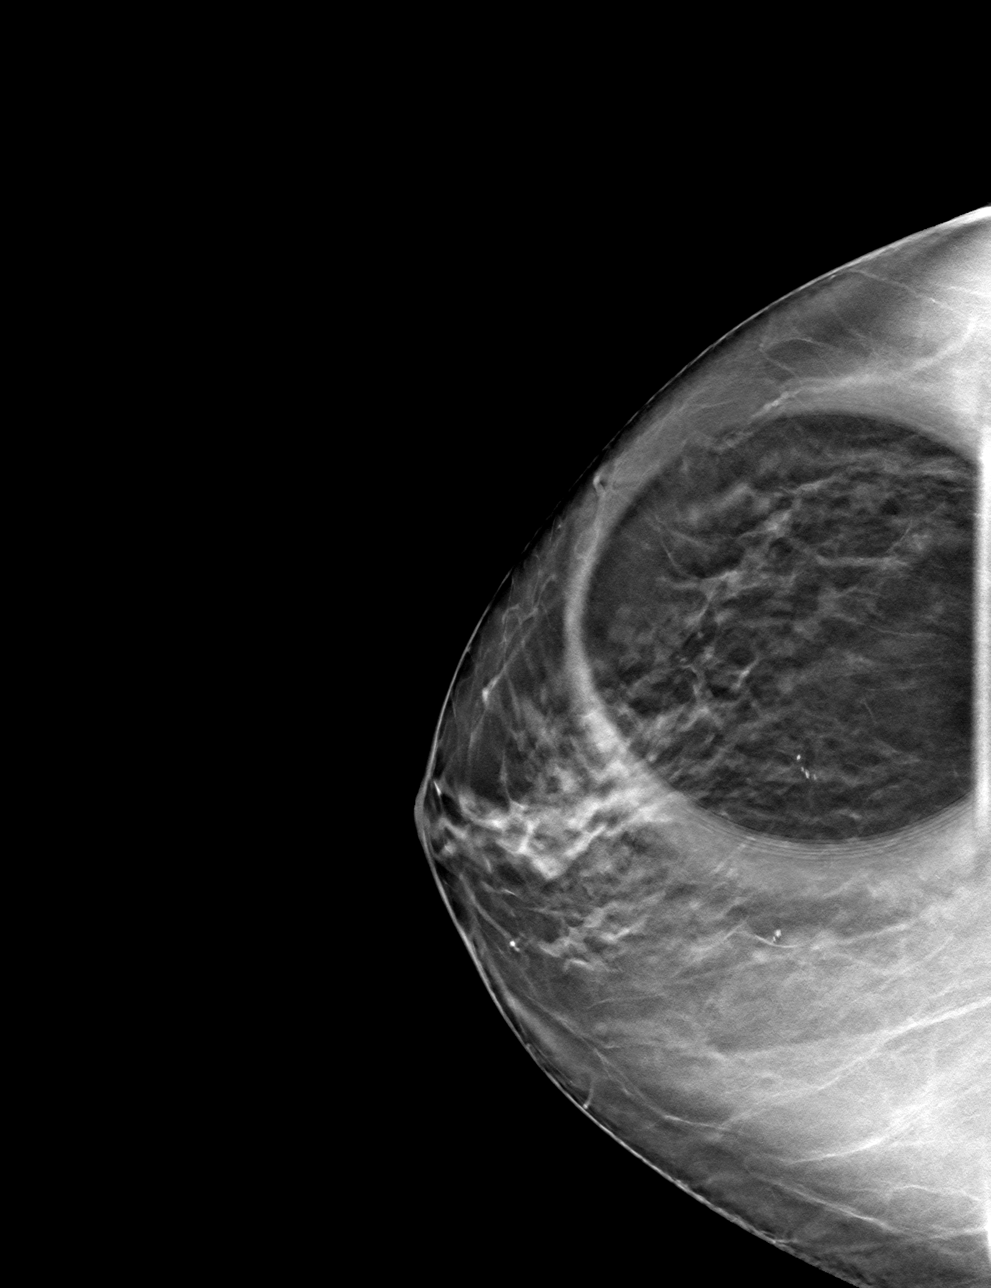

[R MLO tomo · tomo slice 33/66.0]
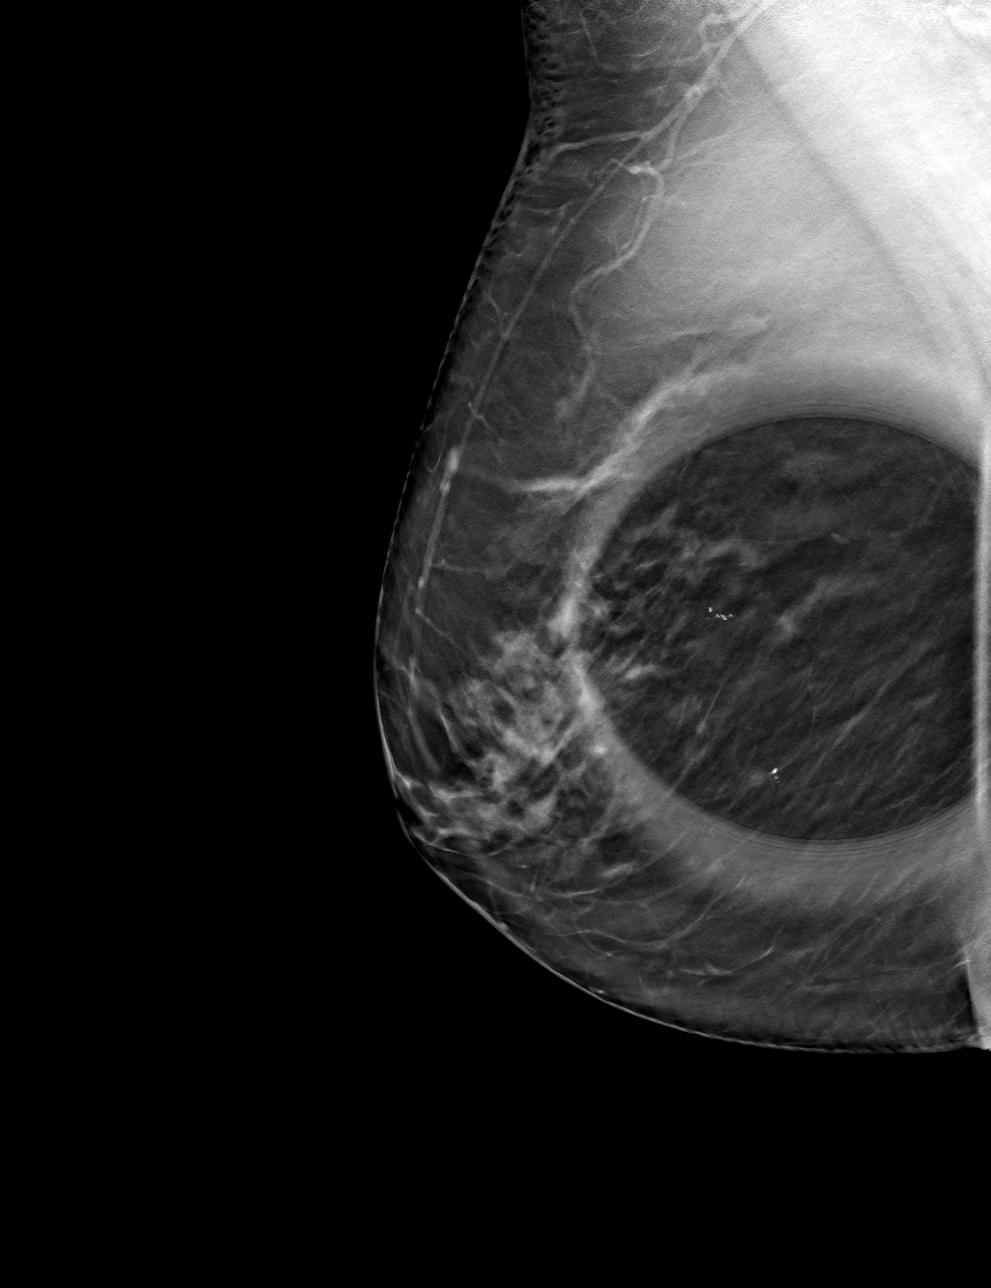

[4 of 12 positions shown; findings below may reference images not displayed]

ACR Breast Density Category b: There are scattered areas of
fibroglandular density.
FINDINGS: 3D tomographic and 2D generated spot compression views of the right
breast confirm a 4 mm oval, circumscribed mass in the mid to
posterior portion of the outer right breast. This has not changed
significantly since the patient's 1st screening 3D mammogram dated
11/16/2016. This is not visible on 2D examinations prior to that
time.

On physical exam, no mass is palpable in the outer right breast.

Targeted ultrasound is performed, showing a 4 mm oval,
circumscribed, hypoechoic mass with scattered small internal echoes
in the 11 o'clock position of the right breast, 2 cm from the
nipple. No internal blood flow was seen with power Doppler. No other
abnormalities were demonstrated.
IMPRESSION: Small, benign, mildly complicated cyst in 11 o'clock position of the
right breast. No evidence of malignancy.

RECOMMENDATION:
Bilateral screening mammogram in 1 year.

I have discussed the findings and recommendations with the patient.
If applicable, a reminder letter will be sent to the patient
regarding the next appointment.

BI-RADS CATEGORY  2: Benign.

## 2020-09-18 IMAGING — US US BREAST*R* LIMITED INC AXILLA
1 series · 5 of 5 positions shown · non-contrast
Comparison: Previous exam(s).

CLINICAL DATA: Possible mass in the outer right breast on a recent
screening mammogram.

EXAM:
DIGITAL DIAGNOSTIC RIGHT MAMMOGRAM WITH TOMO
ULTRASOUND RIGHT BREAST

[Series 1: us breast*right* limited inc axilla · 0.07mm/px · 5 of 5 slices shown]
[im 1/5]
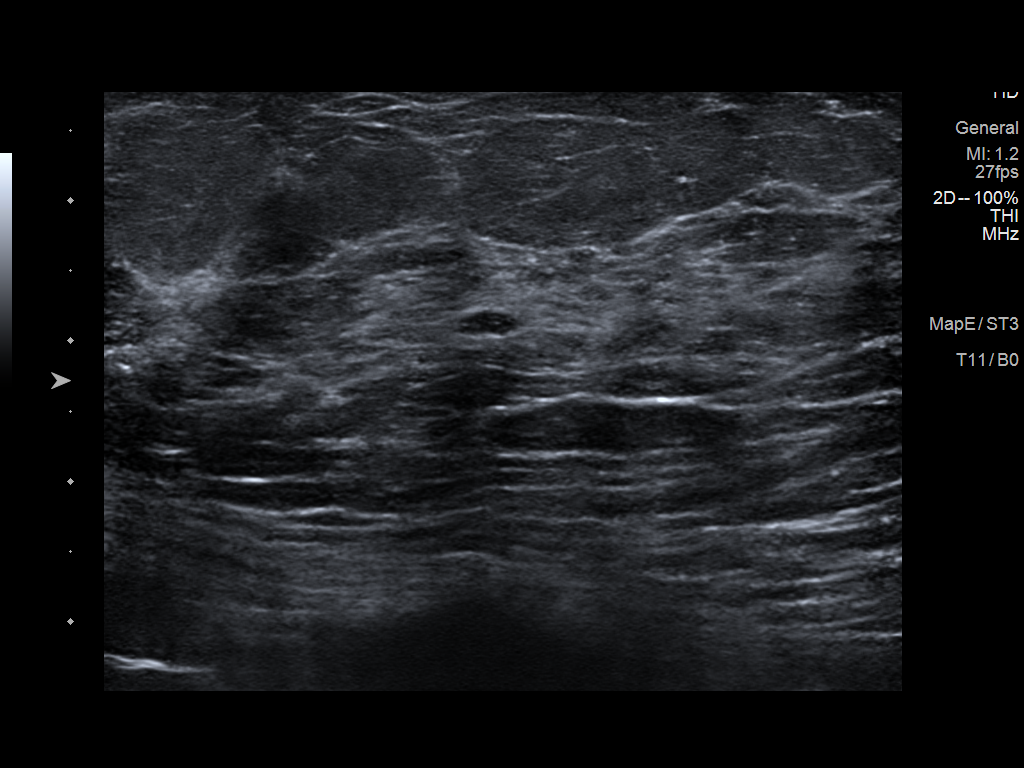
[im 2/5]
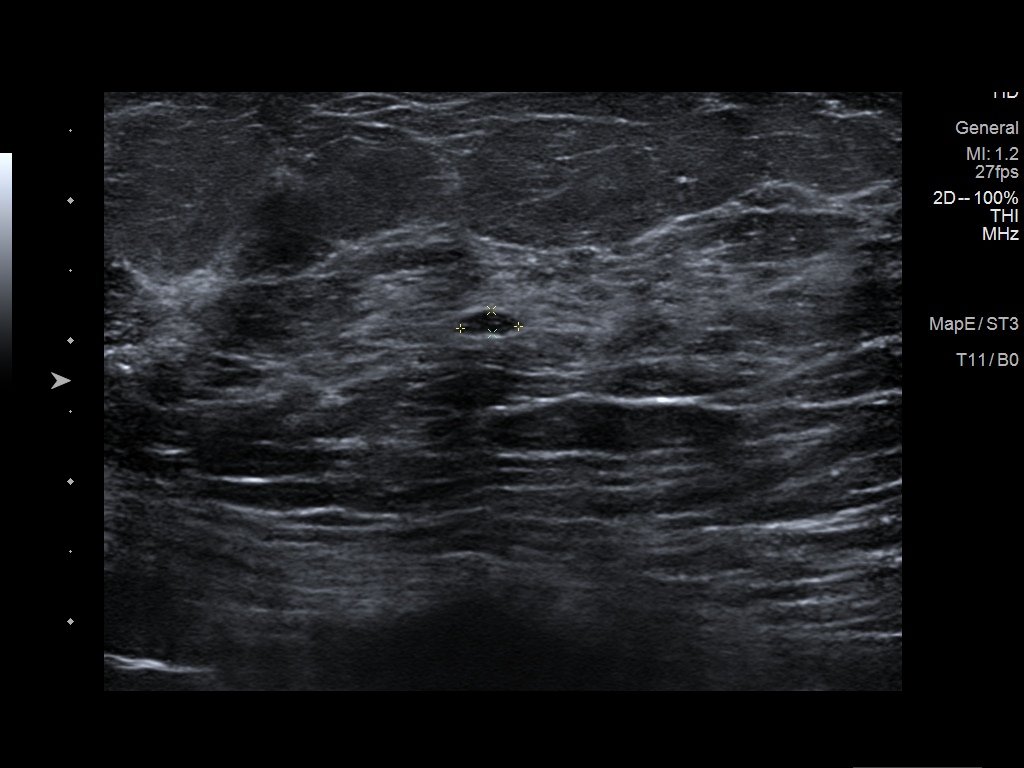
[im 3/5]
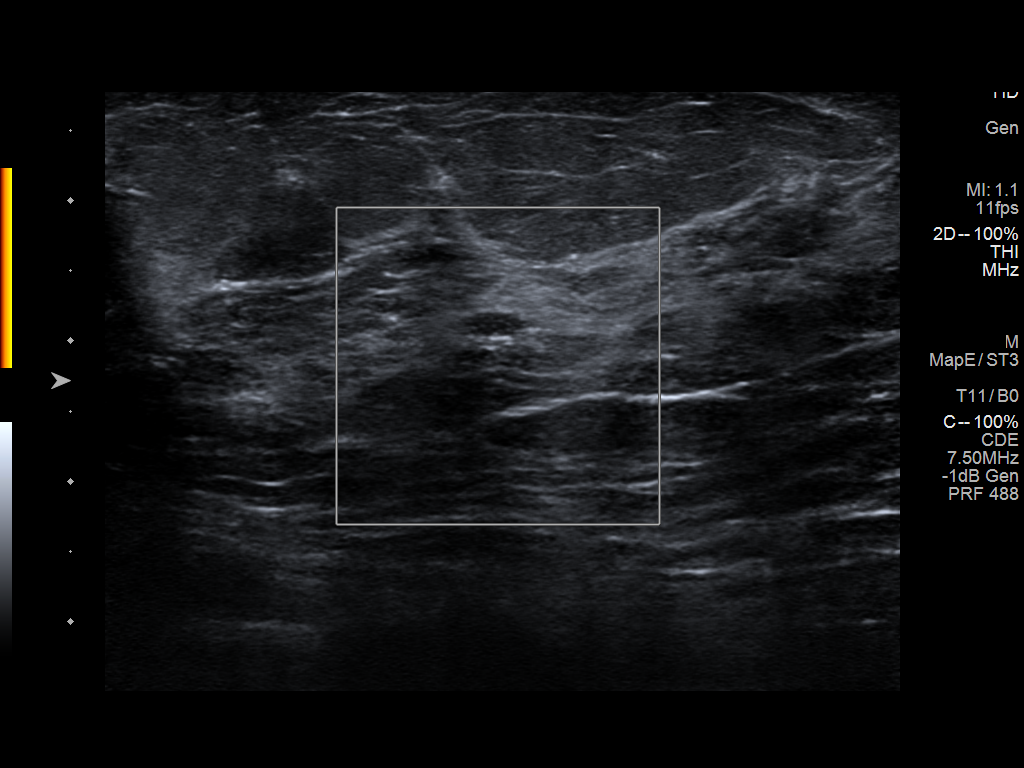
[im 4/5]
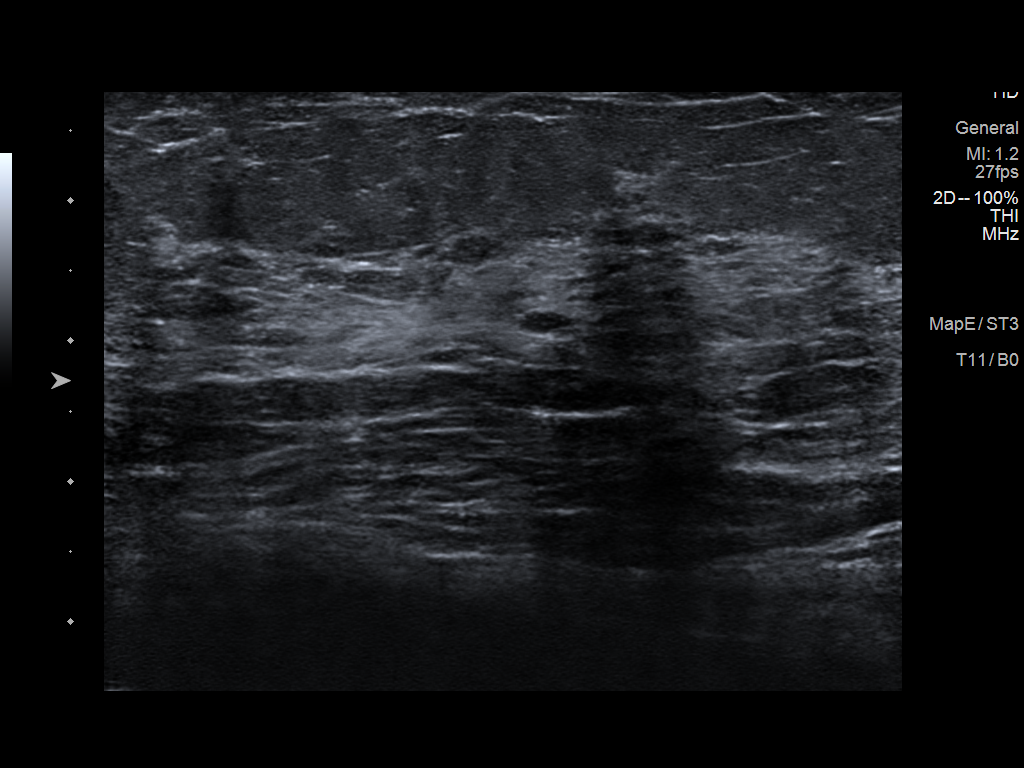
[im 5/5]
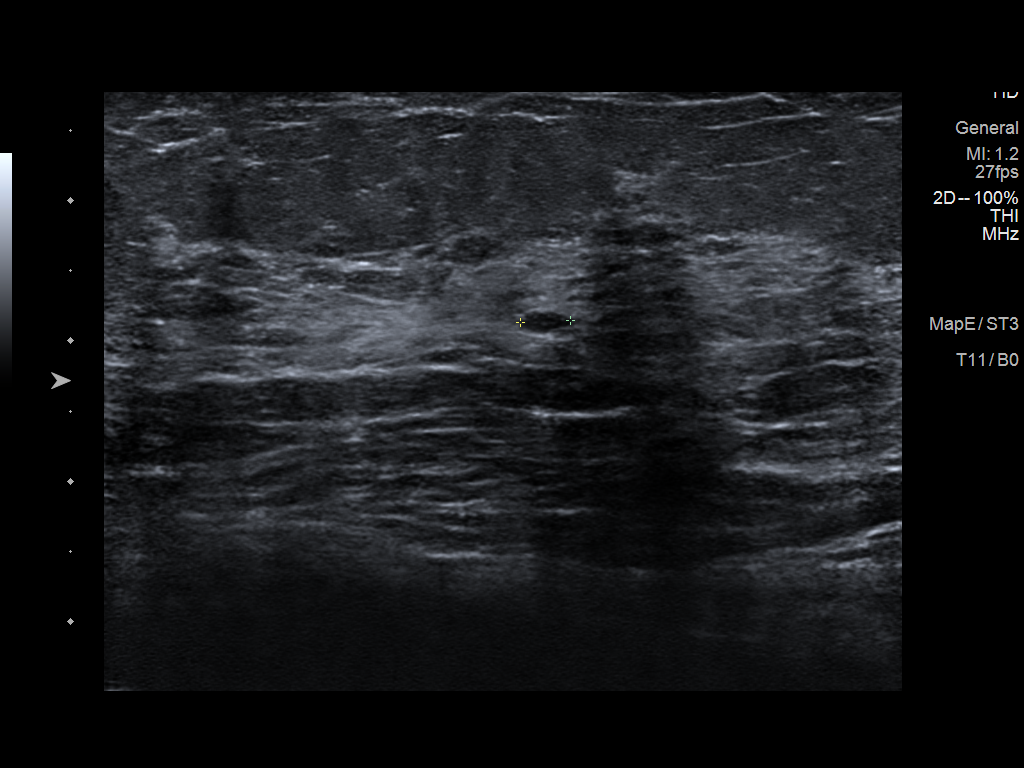

[5 of 5 positions shown; findings below may reference images not displayed]

ACR Breast Density Category b: There are scattered areas of
fibroglandular density.
FINDINGS: 3D tomographic and 2D generated spot compression views of the right
breast confirm a 4 mm oval, circumscribed mass in the mid to
posterior portion of the outer right breast. This has not changed
significantly since the patient's 1st screening 3D mammogram dated
11/16/2016. This is not visible on 2D examinations prior to that
time.

On physical exam, no mass is palpable in the outer right breast.

Targeted ultrasound is performed, showing a 4 mm oval,
circumscribed, hypoechoic mass with scattered small internal echoes
in the 11 o'clock position of the right breast, 2 cm from the
nipple. No internal blood flow was seen with power Doppler. No other
abnormalities were demonstrated.
IMPRESSION: Small, benign, mildly complicated cyst in 11 o'clock position of the
right breast. No evidence of malignancy.

RECOMMENDATION:
Bilateral screening mammogram in 1 year.

I have discussed the findings and recommendations with the patient.
If applicable, a reminder letter will be sent to the patient
regarding the next appointment.

BI-RADS CATEGORY  2: Benign.

## 2020-09-22 ENCOUNTER — Ambulatory Visit: Payer: Medicare PPO | Admitting: Family Medicine

## 2020-09-24 ENCOUNTER — Ambulatory Visit (INDEPENDENT_AMBULATORY_CARE_PROVIDER_SITE_OTHER): Payer: Medicare PPO | Admitting: Family Medicine

## 2020-09-24 ENCOUNTER — Encounter: Payer: Self-pay | Admitting: Family Medicine

## 2020-09-24 ENCOUNTER — Other Ambulatory Visit: Payer: Self-pay

## 2020-09-24 VITALS — BP 144/81 | HR 93 | Temp 98.4°F | Ht 66.0 in | Wt 162.7 lb

## 2020-09-24 DIAGNOSIS — R140 Abdominal distension (gaseous): Secondary | ICD-10-CM

## 2020-09-24 DIAGNOSIS — K219 Gastro-esophageal reflux disease without esophagitis: Secondary | ICD-10-CM

## 2020-09-24 NOTE — Patient Instructions (Signed)
Simethicone oral tablets or capsules What is this medicine? SIMETHICONE (sye METH i kone) is used to decrease the discomfort caused by gas. This medicine may be used for other purposes; ask your health care provider or pharmacist if you have questions. COMMON BRAND NAME(S): Gas Free, Gas Relief, Gas-X, Gas-X Extra Strength, Gas-X Ultra Strength, GasAid, Mylanta Gas, Phazyme What should I tell my health care provider before I take this medicine? They need to know if you have any of these conditions:  an unusual or allergic reaction to simethicone, other medicines, foods, dyes, or preservatives  pregnant or trying to get pregnant  breast-feeding How should I use this medicine? Take this medicine by mouth with a glass of water. Follow the directions on the label or those given to you by your doctor or health care professional. Do not take your medicine more often than directed. Talk to your pediatrician regarding the use of this medicine in children. Special care may be needed. While this medicine may be used in children as young as 12 years for selected conditions, precautions do apply. Overdosage: If you think you have taken too much of this medicine contact a poison control center or emergency room at once. NOTE: This medicine is only for you. Do not share this medicine with others. What if I miss a dose? This does not apply. You will only use this medicine as needed for gas pain. Do not use double or extra doses. What may interact with this medicine? Interactions are not expected. This list may not describe all possible interactions. Give your health care provider a list of all the medicines, herbs, non-prescription drugs, or dietary supplements you use. Also tell them if you smoke, drink alcohol, or use illegal drugs. Some items may interact with your medicine. What should I watch for while using this medicine? Tell your doctor or health care professional if your symptoms get worse, or if  you have severe pain, diarrhea, constipation, or blood in your stool. These could be signs of a more serious condition. What side effects may I notice from receiving this medicine? There are no reported side effects of this medicine. This list may not describe all possible side effects. Call your doctor for medical advice about side effects. You may report side effects to FDA at 1-800-FDA-1088. Where should I keep my medicine? Keep out of the reach of children. Store at room temperature between 15 and 30 degrees C (59 and 86 degrees F). Keep container tightly closed. Throw away any unused medicine after the expiration date. NOTE: This sheet is a summary. It may not cover all possible information. If you have questions about this medicine, talk to your doctor, pharmacist, or health care provider.  2020 Elsevier/Gold Standard (2008-05-09 13:07:08)

## 2020-09-24 NOTE — Progress Notes (Signed)
Acute Office Visit  Subjective:    Patient ID: Angela Valenzuela, female    DOB: 11-13-1950, 70 y.o.   MRN: [id number]  Chief Complaint  Patient presents with  . Abdominal Pain    HPI Patient is in today for c/o of burping excessively x 3 weeks. Pt says she changed diet after episodes of indigestion by avoiding coffee and eating less.  Takes tums twice a day and says it is giving her some relief. Pt wanted to express the concern of her liver problem with the issue today and changing medications.  She is having intermittent heartburn which is relieved by tums.  Was not worsened by stress.   Past Medical History:  Diagnosis Date  . Allergy   . Cataract   . Hyperlipidemia   . Hypertension   . Increased liver enzymes 03/17/2015  . Paroxysmal digital cyanosis 03/17/2015   Overview:   a.  Positive anticentromere antibody b.   Sicca-xerostomia c.  RVSP 41 in 2013   . Raynaud's disease 04/30/2015  . Thyroid disease     Past Surgical History:  Procedure Laterality Date  . EYE SURGERY    . Phototherapeutic Keratectomy      Family History  Problem Relation Age of Onset  . Hypertension Father   . Stroke Father   . Alzheimer's disease Mother     Social History   Socioeconomic History  . Marital status: Married    Spouse name: Not on file  . Number of children: 1  . Years of education: Not on file  . Highest education level: Some college, no degree  Occupational History  . Occupation: retired  Tobacco Use  . Smoking status: Never Smoker  . Smokeless tobacco: Never Used  Vaping Use  . Vaping Use: Never used  Substance and Sexual Activity  . Alcohol use: Not Currently    Alcohol/week: 0.0 standard drinks  . Drug use: No  . Sexual activity: Not on file  Other Topics Concern  . Not on file  Social History Narrative  . Not on file   Social Determinants of Health   Financial Resource Strain: Low Risk   . Difficulty of Paying Living Expenses: Not hard at all  Food  Insecurity: No Food Insecurity  . Worried About Programme researcher, broadcasting/film/video in the Last Year: Never true  . Ran Out of Food in the Last Year: Never true  Transportation Needs: No Transportation Needs  . Lack of Transportation (Medical): No  . Lack of Transportation (Non-Medical): No  Physical Activity: Insufficiently Active  . Days of Exercise per Week: 3 days  . Minutes of Exercise per Session: 20 min  Stress: No Stress Concern Present  . Feeling of Stress : Not at all  Social Connections: Moderately Integrated  . Frequency of Communication with Friends and Family: More than three times a week  . Frequency of Social Gatherings with Friends and Family: More than three times a week  . Attends Religious Services: Never  . Active Member of Clubs or Organizations: Yes  . Attends Banker Meetings: More than 4 times per year  . Marital Status: Married  Catering manager Violence: Not At Risk  . Fear of Current or Ex-Partner: No  . Emotionally Abused: No  . Physically Abused: No  . Sexually Abused: No    Outpatient Medications Prior to Visit  Medication Sig Dispense Refill  . amLODipine (NORVASC) 5 MG tablet TAKE 1.5 TABLETS (7.5 MG TOTAL) BY MOUTH  DAILY. 135 tablet 1  . calcium carbonate (TUMS EX) 750 MG chewable tablet Chew 1 tablet by mouth daily.    . Calcium-Vitamin D 600-200 MG-UNIT per tablet Take 1 tablet by mouth daily.     . diclofenac Sodium (VOLTAREN) 1 % GEL Apply 1 application topically daily. As needed    . PREVIDENT 5000 DRY MOUTH 1.1 % GEL dental gel USE AS DIRECTED 1 TIME DAILY  3  . RA KRILL OIL 500 MG CAPS Take 1 capsule by mouth daily.     . simvastatin (ZOCOR) 20 MG tablet TAKE 1 TABLET BY MOUTH EVERYDAY AT BEDTIME 90 tablet 1  . ursodiol (ACTIGALL) 500 MG tablet Take 1 tablet (500 mg total) by mouth 3 (three) times daily. **PLEASE SCHEDULE FOLLOW UP APPT** 270 tablet 3  . prednisoLONE acetate (PRED FORTE) 1 % ophthalmic suspension Apply 1 drop to eye 3 (three)  times daily. Tapering down until December, then d/c (Patient not taking: Reported on 09/24/2020)     No facility-administered medications prior to visit.    No Known Allergies  Review of Systems  Gastrointestinal: Negative for abdominal pain, constipation, diarrhea, nausea and vomiting.       Pt feels like something is "catching" in her LLQ of abdomen but says it comes and goes       Objective:    Physical Exam Constitutional:      General: She is not in acute distress.    Appearance: She is well-developed.  HENT:     Head: Normocephalic and atraumatic.  Eyes:     General: No scleral icterus. Cardiovascular:     Rate and Rhythm: Normal rate and regular rhythm.     Heart sounds: Normal heart sounds. No murmur heard.   Pulmonary:     Effort: Pulmonary effort is normal. No respiratory distress.     Breath sounds: Normal breath sounds. No wheezing.  Abdominal:     General: Abdomen is flat. Bowel sounds are normal. There is no distension.     Palpations: Abdomen is soft. There is no hepatomegaly.     Tenderness: There is no abdominal tenderness. There is no guarding or rebound.  Skin:    General: Skin is warm and dry.     Findings: No rash.  Neurological:     Mental Status: She is alert and oriented to person, place, and time.     BP (!) 144/81 (BP Location: Left Arm, Patient Position: Sitting, Cuff Size: Large)   Pulse 93   Temp 98.4 F (36.9 C) (Oral)   Ht 5\' 6"  (1.676 m)   Wt 162 lb 11.2 oz (73.8 kg)   BMI 26.26 kg/m  Wt Readings from Last 3 Encounters:  09/24/20 162 lb 11.2 oz (73.8 kg)  07/27/20 172 lb 9.6 oz (78.3 kg)  01/31/20 174 lb 3.2 oz (79 kg)    Health Maintenance Due  Topic Date Due  . COVID-19 Vaccine (3 - Pfizer risk 4-dose series) 12/17/2019    There are no preventive care reminders to display for this patient.   Lab Results  Component Value Date   TSH 3.400 07/31/2020   Lab Results  Component Value Date   WBC 4.9 07/26/2019   HGB 14.1  07/26/2019   HCT 43.4 07/26/2019   MCV 88 07/26/2019   PLT 221 07/26/2019   Lab Results  Component Value Date   NA 141 07/31/2020   K 4.2 07/31/2020   CO2 26 07/31/2020   GLUCOSE 101 (H) 07/31/2020  BUN 14 07/31/2020   CREATININE 0.81 07/31/2020   BILITOT 0.3 07/31/2020   ALKPHOS 107 07/31/2020   AST 19 07/31/2020   ALT 14 07/31/2020   PROT 6.4 07/31/2020   ALBUMIN 4.5 07/31/2020   CALCIUM 9.5 07/31/2020   Lab Results  Component Value Date   CHOL 182 07/31/2020   Lab Results  Component Value Date   HDL 94 07/31/2020   Lab Results  Component Value Date   LDLCALC 76 07/31/2020   Lab Results  Component Value Date   TRIG 61 07/31/2020   Lab Results  Component Value Date   CHOLHDL 2.2 03/27/2018   No results found for: HGBA1C     Assessment & Plan:    1. Flatulence/gas pain/belching - new problem - may be related to Ursodiol - Continue tums prn - continue diet modificaitons - reassurance given - gasX prn  2. Gastroesophageal reflux disease without esophagitis - new problem - well controlled with Tums - continue - can use pepcid if needed - return precautions discussed    I, [name], MD, have reviewed all documentation for this visit. The documentation on 09/24/20 for the exam, diagnosis, procedures, and orders are all accurate and complete.   Bacigalupo, Dionne Bucy, MD, MPH [facility]

## 2020-10-13 DIAGNOSIS — H18523 Epithelial (juvenile) corneal dystrophy, bilateral: Secondary | ICD-10-CM | POA: Diagnosis not present

## 2020-10-13 DIAGNOSIS — H04123 Dry eye syndrome of bilateral lacrimal glands: Secondary | ICD-10-CM | POA: Diagnosis not present

## 2020-10-13 DIAGNOSIS — H25813 Combined forms of age-related cataract, bilateral: Secondary | ICD-10-CM | POA: Diagnosis not present

## 2020-11-17 DIAGNOSIS — M3501 Sicca syndrome with keratoconjunctivitis: Secondary | ICD-10-CM | POA: Diagnosis not present

## 2020-11-17 DIAGNOSIS — M349 Systemic sclerosis, unspecified: Secondary | ICD-10-CM | POA: Diagnosis not present

## 2020-11-17 DIAGNOSIS — K743 Primary biliary cirrhosis: Secondary | ICD-10-CM | POA: Diagnosis not present

## 2020-12-17 ENCOUNTER — Encounter: Payer: Self-pay | Admitting: Family Medicine

## 2020-12-18 HISTORY — PX: EYE SURGERY: SHX253

## 2020-12-23 DIAGNOSIS — H52202 Unspecified astigmatism, left eye: Secondary | ICD-10-CM | POA: Diagnosis not present

## 2020-12-23 DIAGNOSIS — H25812 Combined forms of age-related cataract, left eye: Secondary | ICD-10-CM | POA: Diagnosis not present

## 2021-01-05 ENCOUNTER — Encounter: Payer: Self-pay | Admitting: Family Medicine

## 2021-01-06 ENCOUNTER — Encounter: Payer: Self-pay | Admitting: Family Medicine

## 2021-01-06 DIAGNOSIS — H25811 Combined forms of age-related cataract, right eye: Secondary | ICD-10-CM | POA: Diagnosis not present

## 2021-01-06 DIAGNOSIS — H52201 Unspecified astigmatism, right eye: Secondary | ICD-10-CM | POA: Diagnosis not present

## 2021-01-06 DIAGNOSIS — H52221 Regular astigmatism, right eye: Secondary | ICD-10-CM | POA: Diagnosis not present

## 2021-01-26 ENCOUNTER — Ambulatory Visit: Payer: Self-pay | Admitting: Family Medicine

## 2021-01-28 ENCOUNTER — Ambulatory Visit: Payer: Medicare PPO | Admitting: Family Medicine

## 2021-01-28 ENCOUNTER — Encounter: Payer: Self-pay | Admitting: Family Medicine

## 2021-01-28 ENCOUNTER — Other Ambulatory Visit: Payer: Self-pay

## 2021-01-28 VITALS — BP 132/68 | HR 81 | Temp 98.8°F | Resp 16 | Ht 66.0 in | Wt 166.2 lb

## 2021-01-28 DIAGNOSIS — E038 Other specified hypothyroidism: Secondary | ICD-10-CM

## 2021-01-28 DIAGNOSIS — K743 Primary biliary cirrhosis: Secondary | ICD-10-CM

## 2021-01-28 DIAGNOSIS — I10 Essential (primary) hypertension: Secondary | ICD-10-CM | POA: Diagnosis not present

## 2021-01-28 DIAGNOSIS — E782 Mixed hyperlipidemia: Secondary | ICD-10-CM

## 2021-01-28 DIAGNOSIS — M349 Systemic sclerosis, unspecified: Secondary | ICD-10-CM | POA: Diagnosis not present

## 2021-01-28 NOTE — Assessment & Plan Note (Signed)
Followed by Rheum

## 2021-01-28 NOTE — Progress Notes (Signed)
Established patient visit   Patient: Angela BlazerLinda M Valenzuela   DOB: May 05, 1951   70 y.o. Female  MRN: [id number] Visit Date: 01/28/2021  Today's healthcare provider: Shirlee LatchAngela Hakeen Shipes, MD   Chief Complaint  Patient presents with  . Hypertension  . Hyperlipidemia   Subjective    Hypertension Pertinent negatives include no chest pain, headaches, neck pain, palpitations or shortness of breath.  Hyperlipidemia Pertinent negatives include no chest pain or shortness of breath.    Angela QuinLinda is doing well today. She is interested in getting her liver function checked today.  Digestive issues She is having digestive issues with persistent burping in the morning that resolves. She reports her symptoms don't occur everyday. She is concerned issues with loose bowls that may be related to her . She is interested in seeing a gastroenterologist if her symptoms persist. She denies difficulty swallowing however, she has a history of dry mouth but as long as she has water she doesn't have any additional issues.   Vaccines She has received both COVID vaccines but she wanted to receive more information about receiving the booster.   Hypertension, follow-up  BP Readings from Last 3 Encounters:  01/28/21 132/68  09/24/20 (!) 144/81  07/27/20 115/77   Wt Readings from Last 3 Encounters:  01/28/21 166 lb 3.2 oz (75.4 kg)  09/24/20 162 lb 11.2 oz (73.8 kg)  07/27/20 172 lb 9.6 oz (78.3 kg)     She was last seen for hypertension 6 months ago.  BP at that visit was 115/77. Management since that visit includes no changes.  She reports excellent compliance with treatment. She is not having side effects. She is following a Regular diet. She is exercising. She does not smoke.  Use of agents associated with hypertension: none.   Outside blood pressures are stable. 137/73 Symptoms: No chest pain No chest pressure  No palpitations No syncope  No dyspnea No orthopnea  No paroxysmal nocturnal dyspnea  YES lower extremity edema   Pertinent labs: Lab Results  Component Value Date   CHOL 182 07/31/2020   HDL 94 07/31/2020   LDLCALC 76 07/31/2020   TRIG 61 07/31/2020   CHOLHDL 2.2 03/27/2018   Lab Results  Component Value Date   NA 141 07/31/2020   K 4.2 07/31/2020   CREATININE 0.81 07/31/2020   GFRNONAA 74 07/31/2020   GFRAA 86 07/31/2020   GLUCOSE 101 (H) 07/31/2020     The 10-year ASCVD risk score Denman George(Goff DC Jr., et al., 2013) is: 11.8%   --------------------------------------------------------------------------------------------------- Lipid/Cholesterol, Follow-up  Last lipid panel Other pertinent labs  Lab Results  Component Value Date   CHOL 182 07/31/2020   HDL 94 07/31/2020   LDLCALC 76 07/31/2020   TRIG 61 07/31/2020   CHOLHDL 2.2 03/27/2018   Lab Results  Component Value Date   ALT 14 07/31/2020   AST 19 07/31/2020   PLT 221 07/26/2019   TSH 3.400 07/31/2020     She was last seen for this 6 months ago.  Management since that visit includes no changes.  She reports excellent compliance with treatment. She is not having side effects.   Symptoms: No chest pain No chest pressure/discomfort  No dyspnea Yes lower extremity edema  Yes numbness or tingling of extremity No orthopnea  No palpitations No paroxysmal nocturnal dyspnea  No speech difficulty No syncope   Current diet: in general, a "healthy" diet   Current exercise: housecleaning and stretching   The 10-year ASCVD risk score (  Elayne Valenzuela., et al., 2013) is: 11.8%  --------------------------------------------------------------------------------------------------- Hypothyroid, follow-up  Lab Results  Component Value Date   TSH 3.400 07/31/2020   TSH 2.260 01/31/2020   TSH 3.580 07/26/2019   FREET4 1.42 01/31/2020   FREET4 1.26 07/26/2019   Wt Readings from Last 3 Encounters:  01/28/21 166 lb 3.2 oz (75.4 kg)  09/24/20 162 lb 11.2 oz (73.8 kg)  07/27/20 172 lb 9.6 oz (78.3 kg)    She  was last seen for hypothyroid 6 months ago.  Management since that visit includes no changes. She reports excellent compliance with treatment. She is not having side effects.   Symptoms: No change in energy level No constipation  Yes diarrhea No heat / cold intolerance  No nervousness No palpitations  No weight changes    -----------------------------------------------------------------------------------------  Patient Active Problem List   Diagnosis Date Noted  . Osteopenia of neck of left femur 10/18/2017  . Posterior tibial tendinitis of left leg 08/17/2017  . Peripheral neuropathy 07/14/2017  . Primary biliary cholangitis (HCC) 11/22/2016  . Kidney cysts 06/24/2015  . Dermatitis, eczematoid 06/24/2015  . Dry mouth 06/24/2015  . 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 Subclinical hypothyroidism 06/24/2015  . Epidermoid carcinoma 06/24/2015  . Hypertension 04/30/2015  . Hyperlipidemia 04/30/2015  . Paroxysmal digital cyanosis 03/17/2015  . Raynaud's syndrome without gangrene 01/09/2008  . Allergic rhinitis 01/21/1999   Social History   Tobacco Use  . Smoking status: Never Smoker  . Smokeless tobacco: Never Used  Vaping Use  . Vaping Use: Never used  Substance Use Topics  . Alcohol use: Not Currently    Alcohol/week: 0.0 standard drinks  . Drug use: No   No Known Allergies     Medications: Outpatient Medications Prior to Visit  Medication Sig  . amLODipine (NORVASC) 5 MG tablet TAKE 1.5 TABLETS (7.5 MG TOTAL) BY MOUTH DAILY.  . calcium carbonate (TUMS EX) 750 MG chewable tablet Chew 1 tablet by mouth daily.  . Calcium-Vitamin D 600-200 MG-UNIT per tablet Take 1 tablet by mouth daily.   . diclofenac Sodium (VOLTAREN) 1 % GEL Apply 1 application topically daily. As needed  . Lifitegrast (XIIDRA) 5 % SOLN Apply 2 drops to eye daily.  Marland Kitchen PREVIDENT 5000 DRY MOUTH 1.1 % GEL dental gel USE AS DIRECTED 1 TIME DAILY  . RA KRILL OIL 500 MG CAPS Take 1 capsule by mouth  daily.   . simvastatin (ZOCOR) 20 MG tablet TAKE 1 TABLET BY MOUTH EVERYDAY AT BEDTIME  . ursodiol (ACTIGALL) 500 MG tablet Take 1 tablet (500 mg total) by mouth 3 (three) times daily. **PLEASE SCHEDULE FOLLOW UP APPT**  . [DISCONTINUED] prednisoLONE acetate (PRED FORTE) 1 % ophthalmic suspension Apply 1 drop to eye 3 (three) times daily. Tapering down until December, then d/c (Patient not taking: Reported on 01/28/2021)   No facility-administered medications prior to visit.    Review of Systems  Constitutional: Negative for activity change, appetite change, chills, fatigue, fever and unexpected weight change.  HENT: Negative for ear pain, nosebleeds, sinus pressure, sinus pain and sore throat.   Eyes: Negative for pain.  Respiratory: Negative for cough, chest tightness, shortness of breath and wheezing.   Cardiovascular: Positive for leg swelling. Negative for chest pain and palpitations.  Gastrointestinal: Positive for diarrhea. Negative for abdominal pain, blood in stool, constipation, rectal pain and vomiting.  Genitourinary: Negative for dysuria, flank pain, frequency, pelvic pain and urgency.  Musculoskeletal: Negative for back pain, neck pain and neck stiffness.  Neurological: Negative for dizziness, syncope, weakness, light-headedness and headaches.       Objective    BP 132/68 (BP Location: Right Arm, Patient Position: Sitting, Cuff Size: Large)   Pulse 81   Temp 98.8 F (37.1 C) (Oral)   Resp 16   Ht 5\' 6"  (1.676 m)   Wt 166 lb 3.2 oz (75.4 kg)   SpO2 100%   BMI 26.83 kg/m  BP Readings from Last 3 Encounters:  01/28/21 132/68  09/24/20 (!) 144/81  07/27/20 115/77   Wt Readings from Last 3 Encounters:  01/28/21 166 lb 3.2 oz (75.4 kg)  09/24/20 162 lb 11.2 oz (73.8 kg)  07/27/20 172 lb 9.6 oz (78.3 kg)       Physical Exam Vitals reviewed.  Constitutional:      General: She is not in acute distress.    Appearance: Normal appearance. She is well-developed. She  is not diaphoretic.  HENT:     Head: Normocephalic and atraumatic.  Eyes:     General: No scleral icterus.    Conjunctiva/sclera: Conjunctivae normal.  Neck:     Thyroid: No thyromegaly.  Cardiovascular:     Rate and Rhythm: Normal rate and regular rhythm.     Pulses: Normal pulses.     Heart sounds: Normal heart sounds. No murmur heard.   Pulmonary:     Effort: Pulmonary effort is normal. No respiratory distress.     Breath sounds: Normal breath sounds. No wheezing, rhonchi or rales.  Musculoskeletal:     Cervical back: Neck supple.     Right lower leg: 1+ Edema present.     Left lower leg: 1+ Edema present.  Lymphadenopathy:     Cervical: No cervical adenopathy.  Skin:    General: Skin is warm and dry.     Findings: No rash.  Neurological:     Mental Status: She is alert and oriented to person, place, and time. Mental status is at baseline.  Psychiatric:        Mood and Affect: Mood normal.        Behavior: Behavior normal.       No results found for any visits on 01/28/21.  Assessment & Plan     Problem List Items Addressed This Visit      Cardiovascular and Mediastinum   Hypertension - Primary   Relevant Orders   Comprehensive metabolic panel     Digestive   Primary biliary cholangitis (HCC)    Continue to monitor LFTs Continue ursidiol Consider that ursidiol may be contributing to loose stools If these worsen, we will refer back to GI      Relevant Orders   Comprehensive metabolic panel     Endocrine   Subclinical hypothyroidism    History of Recent TSH wnl asymptomatic        Musculoskeletal and Integument   Buschke's scleredema (HCC)    Followed by Rheum        Other   Hyperlipidemia    Previously well controlled Continue statin Repeat FLP and CMP at next visit          Return in about 6 months (around 07/31/2021) for AWV, CPE.       I,Essence Turner,acting as a scribe for 13/08/2021, MD.,have documented all relevant  documentation on the behalf of [name], MD,as directed by  [name], MD while in the presence of [name], MD.  I, [name], MD, have reviewed all documentation for this visit. The documentation  on 01/28/21 for the exam, diagnosis, procedures, and orders are all accurate and complete.   Lusine Corlett, Marzella Schlein, MD, MPH [facility]

## 2021-01-28 NOTE — Assessment & Plan Note (Signed)
Previously well controlled Continue statin Repeat FLP and CMP at next visit 

## 2021-01-28 NOTE — Assessment & Plan Note (Signed)
History of Recent TSH wnl asymptomatic

## 2021-01-28 NOTE — Assessment & Plan Note (Addendum)
Continue to monitor LFTs Continue ursidiol Consider that ursidiol may be contributing to loose stools If these worsen, we will refer back to GI

## 2021-01-29 LAB — COMPREHENSIVE METABOLIC PANEL
ALT: 11 IU/L (ref 0–32)
AST: 13 IU/L (ref 0–40)
Albumin/Globulin Ratio: 2.8 — ABNORMAL HIGH (ref 1.2–2.2)
Albumin: 4.8 g/dL (ref 3.8–4.8)
Alkaline Phosphatase: 102 IU/L (ref 44–121)
BUN/Creatinine Ratio: 14 (ref 12–28)
BUN: 11 mg/dL (ref 8–27)
Bilirubin Total: 0.4 mg/dL (ref 0.0–1.2)
CO2: 24 mmol/L (ref 20–29)
Calcium: 9.9 mg/dL (ref 8.7–10.3)
Chloride: 103 mmol/L (ref 96–106)
Creatinine, Ser: 0.77 mg/dL (ref 0.57–1.00)
Globulin, Total: 1.7 g/dL (ref 1.5–4.5)
Glucose: 100 mg/dL — ABNORMAL HIGH (ref 65–99)
Potassium: 4.7 mmol/L (ref 3.5–5.2)
Sodium: 142 mmol/L (ref 134–144)
Total Protein: 6.5 g/dL (ref 6.0–8.5)
eGFR: 83 mL/min/{1.73_m2} (ref >59)

## 2021-02-19 ENCOUNTER — Other Ambulatory Visit: Payer: Self-pay | Admitting: Family Medicine

## 2021-02-19 DIAGNOSIS — Z1231 Encounter for screening mammogram for malignant neoplasm of breast: Secondary | ICD-10-CM

## 2021-02-22 DIAGNOSIS — D2261 Melanocytic nevi of right upper limb, including shoulder: Secondary | ICD-10-CM | POA: Diagnosis not present

## 2021-02-22 DIAGNOSIS — D225 Melanocytic nevi of trunk: Secondary | ICD-10-CM | POA: Diagnosis not present

## 2021-02-22 DIAGNOSIS — Z85828 Personal history of other malignant neoplasm of skin: Secondary | ICD-10-CM | POA: Diagnosis not present

## 2021-02-22 DIAGNOSIS — D2262 Melanocytic nevi of left upper limb, including shoulder: Secondary | ICD-10-CM | POA: Diagnosis not present

## 2021-02-22 DIAGNOSIS — D2271 Melanocytic nevi of right lower limb, including hip: Secondary | ICD-10-CM | POA: Diagnosis not present

## 2021-03-01 ENCOUNTER — Other Ambulatory Visit: Payer: Self-pay | Admitting: Family Medicine

## 2021-03-17 DIAGNOSIS — H43812 Vitreous degeneration, left eye: Secondary | ICD-10-CM | POA: Diagnosis not present

## 2021-04-15 ENCOUNTER — Ambulatory Visit: Payer: Medicare PPO

## 2021-04-22 ENCOUNTER — Other Ambulatory Visit: Payer: Self-pay

## 2021-04-22 ENCOUNTER — Ambulatory Visit
Admission: RE | Admit: 2021-04-22 | Discharge: 2021-04-22 | Disposition: A | Discharge status: Home / Self Care | Payer: Medicare PPO | Source: Ambulatory Visit | Attending: Family Medicine | Admitting: Family Medicine

## 2021-04-22 DIAGNOSIS — Z1231 Encounter for screening mammogram for malignant neoplasm of breast: Secondary | ICD-10-CM

## 2021-08-06 ENCOUNTER — Other Ambulatory Visit: Payer: Self-pay

## 2021-08-06 ENCOUNTER — Ambulatory Visit (INDEPENDENT_AMBULATORY_CARE_PROVIDER_SITE_OTHER): Payer: Medicare PPO | Admitting: Family Medicine

## 2021-08-06 ENCOUNTER — Encounter: Payer: Self-pay | Admitting: Family Medicine

## 2021-08-06 VITALS — BP 127/76 | HR 96 | Temp 97.3°F | Resp 16 | Ht 66.0 in | Wt 166.0 lb

## 2021-08-06 DIAGNOSIS — E782 Mixed hyperlipidemia: Secondary | ICD-10-CM

## 2021-08-06 DIAGNOSIS — Z23 Encounter for immunization: Secondary | ICD-10-CM | POA: Insufficient documentation

## 2021-08-06 DIAGNOSIS — I10 Essential (primary) hypertension: Secondary | ICD-10-CM | POA: Diagnosis not present

## 2021-08-06 DIAGNOSIS — Z0000 Encounter for general adult medical examination without abnormal findings: Secondary | ICD-10-CM

## 2021-08-06 DIAGNOSIS — E038 Other specified hypothyroidism: Secondary | ICD-10-CM | POA: Diagnosis not present

## 2021-08-06 DIAGNOSIS — K743 Primary biliary cirrhosis: Secondary | ICD-10-CM | POA: Diagnosis not present

## 2021-08-06 DIAGNOSIS — R739 Hyperglycemia, unspecified: Secondary | ICD-10-CM

## 2021-08-06 NOTE — Progress Notes (Signed)
Annual Wellness Visit     Patient: Angela Valenzuela, Female    DOB: 05-Oct-1950, 70 y.o.   MRN: [id number] Visit Date: 08/06/2021  Today's Provider: [name], MD   Chief Complaint  Patient presents with   Medicare Wellness   Subjective    Angela Valenzuela is a 70 y.o. female who presents today for her Annual Wellness Visit. She reports consuming a general diet. Home exercise routine includes stretching and balance exercises. She generally feels well. She reports sleeping well. She does not have additional problems to discuss today.   Cataract surgery in both eyes in April Checks BP at home regularly. Yesterday: 139/77, 113/67, 127/76  Medications: Outpatient Medications Prior to Visit  Medication Sig   amLODipine (NORVASC) 5 MG tablet TAKE 1.5 TABLETS BY MOUTH DAILY.   calcium carbonate (TUMS EX) 750 MG chewable tablet Chew 1 tablet by mouth daily.   Calcium-Vitamin D 600-200 MG-UNIT per tablet Take 1 tablet by mouth daily.    diclofenac Sodium (VOLTAREN) 1 % GEL Apply 1 application topically daily. As needed   Lifitegrast (XIIDRA) 5 % SOLN Apply 2 drops to eye daily.   PREVIDENT 5000 DRY MOUTH 1.1 % GEL dental gel USE AS DIRECTED 1 TIME DAILY   RA KRILL OIL 500 MG CAPS Take 1 capsule by mouth daily.    simvastatin (ZOCOR) 20 MG tablet TAKE 1 TABLET BY MOUTH EVERYDAY AT BEDTIME   ursodiol (ACTIGALL) 500 MG tablet Take 1 tablet (500 mg total) by mouth 3 (three) times daily. **PLEASE SCHEDULE FOLLOW UP APPT**   No facility-administered medications prior to visit.    No Known Allergies  Patient Care Team: Erasmo Downer, MD as PCP - General (Family Medicine) Kandyce Rud., MD (Rheumatology) Midge Minium, MD as Consulting Physician (Gastroenterology) System, Provider Not In [place], Ronaldo Miyamoto, MD as Referring Physician (Ophthalmology) Melina Fiddler, MD as Consulting Physician (Sports Medicine) Glendale Chard, DO as Consulting Physician  (Neurology)  Review of Systems  Constitutional: Negative.   HENT: Negative.    Eyes: Negative.   Respiratory: Negative.  Negative for chest tightness and shortness of breath.   Cardiovascular: Negative.  Negative for chest pain.  Gastrointestinal:  Positive for diarrhea. Negative for abdominal pain and nausea.  Endocrine: Negative.   Genitourinary: Negative.  Negative for difficulty urinating.  Musculoskeletal: Negative.   Skin: Negative.   Neurological: Negative.   Psychiatric/Behavioral:  The patient is nervous/anxious.        Objective    Vitals: BP 127/76 Comment: home readings  Pulse 96   Temp (!) 97.3 F (36.3 C) (Temporal)   Resp 16   Ht 5\' 6"  (1.676 m)   Wt 166 lb (75.3 kg)   SpO2 99%   BMI 26.79 kg/m    Physical Exam Vitals reviewed.  Constitutional:      General: She is not in acute distress.    Appearance: Normal appearance. She is not ill-appearing or toxic-appearing.  HENT:     Head: Normocephalic and atraumatic.     Right Ear: External ear normal.     Left Ear: External ear normal.     Nose: Nose normal.     Mouth/Throat:     Mouth: Mucous membranes are moist.     Pharynx: Oropharynx is clear. No oropharyngeal exudate or posterior oropharyngeal erythema.  Eyes:     General: No scleral icterus.    Extraocular Movements: Extraocular movements intact.     Conjunctiva/sclera: Conjunctivae  normal.     Pupils: Pupils are equal, round, and reactive to light.  Cardiovascular:     Rate and Rhythm: Normal rate and regular rhythm.     Pulses: Normal pulses.     Heart sounds: Normal heart sounds. No murmur heard.   No friction rub. No gallop.  Pulmonary:     Effort: Pulmonary effort is normal. No respiratory distress.     Breath sounds: Normal breath sounds. No wheezing or rhonchi.  Chest:     Chest wall: No tenderness.  Abdominal:     General: Abdomen is flat. There is no distension.     Palpations: Abdomen is soft.     Tenderness: There is no  abdominal tenderness.  Musculoskeletal:        General: Normal range of motion.     Cervical back: Normal range of motion and neck supple.     Right lower leg: No edema.     Left lower leg: No edema.  Skin:    General: Skin is warm and dry.     Capillary Refill: Capillary refill takes less than 2 seconds.     Findings: No lesion or rash.  Neurological:     General: No focal deficit present.     Mental Status: She is alert and oriented to person, place, and time. Mental status is at baseline.  Psychiatric:        Mood and Affect: Mood normal.    Most recent functional status assessment: In your present state of health, do you have any difficulty performing the following activities: 08/06/2021  Hearing? N  Vision? N  Difficulty concentrating or making decisions? N  Walking or climbing stairs? N  Dressing or bathing? N  Doing errands, shopping? N  Some recent data might be hidden   Most recent fall risk assessment: Fall Risk  01/28/2021  Falls in the past year? 0  Number falls in past yr: 0  Injury with Fall? 0  Risk for fall due to : No Fall Risks  Follow up Falls evaluation completed    Most recent depression screenings: PHQ 2/9 Scores 08/06/2021 01/28/2021  PHQ - 2 Score 0 0  PHQ- 9 Score 0 0   Most recent cognitive screening: 6CIT Screen 07/14/2017  What Year? 0 points  What month? 0 points  What time? 0 points  Count back from 20 0 points  Months in reverse 0 points  Repeat phrase 0 points  Total Score 0   Most recent Audit-C alcohol use screening Alcohol Use Disorder Test (AUDIT) 08/06/2021  1. How often do you have a drink containing alcohol? 0  2. How many drinks containing alcohol do you have on a typical day when you are drinking? 0  3. How often do you have six or more drinks on one occasion? 0  AUDIT-C Score 0  Alcohol Brief Interventions/Follow-up -   A score of 3 or more in women, and 4 or more in men indicates increased risk for alcohol abuse,  EXCEPT if all of the points are from question 1   No results found for any visits on 08/06/21.  Assessment & Plan     Annual wellness visit done today including the all of the following: Reviewed patient's Family Medical History Reviewed and updated list of patient's medical providers Assessment of cognitive impairment was done Assessed patient's functional ability Established a written schedule for health screening services Health Risk Assessent Completed and Reviewed  Exercise Activities and Dietary recommendations  Goals      DIET - REDUCE SUGAR INTAKE     Recommend to cut back on sugar in daily diet to help aid in weight loss.     Exercise 150 minutes per week (moderate activity)        Immunization History  Administered Date(s) Administered   Fluad Quad(high Dose 65+) 07/24/2019, 07/27/2020   Influenza Split 08/08/2012   Influenza, High Dose Seasonal PF 07/01/2016, 07/14/2017, 07/18/2018   Influenza,inj,Quad PF,6+ Mos 07/27/2013, 08/12/2014, 06/25/2015   PFIZER(Purple Top)SARS-COV-2 Vaccination 10/29/2019, 11/19/2019   Pneumococcal Conjugate-13 11/01/2018   Pneumococcal Polysaccharide-23 07/14/2017   Tdap 01/11/2008    Health Maintenance  Topic Date Due   Zoster Vaccines- Shingrix (1 of 2) Never done   COVID-19 Vaccine (3 - Pfizer risk series) 12/17/2019   INFLUENZA VACCINE  04/19/2021   TETANUS/TDAP  07/19/2023 (Originally 01/10/2018)   Fecal DNA (Cologuard)  06/12/2022   DEXA SCAN  10/10/2022   MAMMOGRAM  04/23/2023   Pneumonia Vaccine 93+ Years old  Completed   Hepatitis C Screening  Completed   HPV VACCINES  Aged Out   COLONOSCOPY (Pts 45-49yrs Insurance coverage will need to be confirmed)  Discontinued     Discussed health benefits of physical activity, and encouraged her to engage in regular exercise appropriate for her age and condition.   Problem List Items Addressed This Visit       Cardiovascular and Mediastinum   Hypertension    Well  controlled Continue amlodipine 5mg  F/u in 6 months      Relevant Orders   Comprehensive Metabolic Panel (CMET)   Lipid panel     Digestive   Primary biliary cholangitis (HCC)    Well controlled Continue to monitor LFTs Continue ursodiol 500mg       Relevant Orders   Comprehensive Metabolic Panel (CMET)     Endocrine   Subclinical hypothyroidism    History of subclinical hypothyroidism Most recent TSH wnl Asymptomatic      Relevant Orders   TSH + free T4     Other   Hyperlipidemia    Previously well controlled Continue simvastatin 20mg  Recheck FLP, CMP      Relevant Orders   Comprehensive Metabolic Panel (CMET)   Lipid panel   Need for influenza vaccination   Other Visit Diagnoses     Encounter for Medicare annual wellness exam    -  Primary   Encounter for annual physical exam       Relevant Orders   Comprehensive Metabolic Panel (CMET)   Lipid panel   Hemoglobin A1c   TSH + free T4   Hyperglycemia       Relevant Orders   Hemoglobin A1c        Return in about 6 months (around 02/03/2022) for chronic disease f/u.     , Medical Student 08/06/2021, 11:00 AM  Patient seen along with MS3 student 02/05/2022. I personally evaluated this patient along with the student, and verified all aspects of the history, physical exam, and medical decision making as documented by the student. I agree with the student's documentation and have made all necessary edits.  Jalexia Lalli, Gwyndolyn Kaufman, MD, MPH [facility]

## 2021-08-06 NOTE — Patient Instructions (Addendum)
The CDC recommends two doses of Shingrix (the shingles vaccine) separated by 2 to 6 months for adults age 70 years and older. I recommend checking with your insurance plan regarding coverage for this vaccine.    Ask at the pharmacy about the COVID bivalent booster

## 2021-08-06 NOTE — Assessment & Plan Note (Signed)
Well controlled Continue amlodipine 5mg  F/u in 6 months

## 2021-08-06 NOTE — Assessment & Plan Note (Signed)
Previously well controlled Continue simvastatin 20mg  Recheck FLP, CMP

## 2021-08-06 NOTE — Assessment & Plan Note (Signed)
Well controlled Continue to monitor LFTs Continue ursodiol 500mg 

## 2021-08-06 NOTE — Assessment & Plan Note (Signed)
History of subclinical hypothyroidism Most recent TSH wnl Asymptomatic

## 2021-08-07 LAB — COMPREHENSIVE METABOLIC PANEL
ALT: 16 IU/L (ref 0–32)
AST: 20 IU/L (ref 0–40)
Albumin/Globulin Ratio: 3.6 — ABNORMAL HIGH (ref 1.2–2.2)
Albumin: 5 g/dL — ABNORMAL HIGH (ref 3.8–4.8)
Alkaline Phosphatase: 111 IU/L (ref 44–121)
BUN/Creatinine Ratio: 15 (ref 12–28)
BUN: 11 mg/dL (ref 8–27)
Bilirubin Total: 0.4 mg/dL (ref 0.0–1.2)
CO2: 24 mmol/L (ref 20–29)
Calcium: 10 mg/dL (ref 8.7–10.3)
Chloride: 101 mmol/L (ref 96–106)
Creatinine, Ser: 0.73 mg/dL (ref 0.57–1.00)
Globulin, Total: 1.4 g/dL — ABNORMAL LOW (ref 1.5–4.5)
Glucose: 94 mg/dL (ref 70–99)
Potassium: 4.6 mmol/L (ref 3.5–5.2)
Sodium: 141 mmol/L (ref 134–144)
Total Protein: 6.4 g/dL (ref 6.0–8.5)
eGFR: 88 mL/min/{1.73_m2} (ref >59)

## 2021-08-07 LAB — TSH+FREE T4
Free T4: 1.52 ng/dL (ref 0.82–1.77)
TSH: 2.06 u[IU]/mL (ref 0.450–4.500)

## 2021-08-07 LAB — HEMOGLOBIN A1C
Est. average glucose Bld gHb Est-mCnc: 117 mg/dL
Hgb A1c MFr Bld: 5.7 % — ABNORMAL HIGH (ref 4.8–5.6)

## 2021-08-07 LAB — LIPID PANEL
Chol/HDL Ratio: 1.8 ratio (ref 0.0–4.4)
Cholesterol, Total: 186 mg/dL (ref 100–199)
HDL: 106 mg/dL (ref >39)
LDL Chol Calc (NIH): 69 mg/dL (ref 0–99)
Triglycerides: 54 mg/dL (ref 0–149)
VLDL Cholesterol Cal: 11 mg/dL (ref 5–40)

## 2021-08-16 ENCOUNTER — Encounter: Payer: Self-pay | Admitting: Family Medicine

## 2021-08-27 ENCOUNTER — Other Ambulatory Visit: Payer: Self-pay | Admitting: Gastroenterology

## 2021-09-01 ENCOUNTER — Other Ambulatory Visit: Payer: Self-pay | Admitting: Family Medicine

## 2021-09-01 NOTE — Telephone Encounter (Signed)
Requested Prescriptions  Pending Prescriptions Disp Refills   amLODipine (NORVASC) 5 MG tablet [Pharmacy Med Name: AMLODIPINE BESYLATE 5 MG TAB] 135 tablet 1    Sig: TAKE 1 AND 1/2 TABLETS BY MOUTH EVERY DAY     Cardiovascular:  Calcium Channel Blockers Passed - 09/01/2021  1:38 AM      Passed - Last BP in normal range    BP Readings from Last 1 Encounters:  08/06/21 127/76         Passed - Valid encounter within last 6 months    Recent Outpatient Visits          3 weeks ago Encounter for [employer] annual wellness exam   [facility] [place], Marzella Schlein, MD   7 months ago Primary hypertension   [facility] [place], Marzella Schlein, MD   11 months ago Flatulence/gas pain/belching   [facility] [place], Marzella Schlein, MD   1 year ago Encounter for annual physical exam   [facility] [place], Marzella Schlein, MD   1 year ago Essential hypertension   [facility] [place], Marzella Schlein, MD      Future Appointments            In 5 months [place], Marzella Schlein, MD [facility], [facility]

## 2021-09-04 ENCOUNTER — Encounter: Payer: Self-pay | Admitting: Family Medicine

## 2021-09-06 ENCOUNTER — Other Ambulatory Visit: Payer: Self-pay

## 2021-10-26 ENCOUNTER — Other Ambulatory Visit: Payer: Self-pay | Admitting: Family Medicine

## 2021-10-26 NOTE — Telephone Encounter (Signed)
Requested medication (s) are due for refill today:   Yes  Requested medication (s) are on the active medication list:   Yes  Future visit scheduled:   Yes   Last ordered: 03/01/2021 #90, 1 refill  Returned because lipid panel not within range the last 12 months.   Protocol criteria not met.   Requested Prescriptions  Pending Prescriptions Disp Refills   simvastatin (ZOCOR) 20 MG tablet [Pharmacy Med Name: SIMVASTATIN 20 MG TABLET] 90 tablet 1    Sig: TAKE 1 TABLET BY MOUTH EVERYDAY AT BEDTIME     Cardiovascular:  Antilipid - Statins Failed - 10/26/2021  1:34 AM      Failed - Lipid Panel in normal range within the last 12 months    Cholesterol, Total  Date Value Ref Range Status  08/06/2021 186 100 - 199 mg/dL Final   LDL Chol Calc (NIH)  Date Value Ref Range Status  08/06/2021 69 0 - 99 mg/dL Final   HDL  Date Value Ref Range Status  08/06/2021 106 >39 mg/dL Final   Triglycerides  Date Value Ref Range Status  08/06/2021 54 0 - 149 mg/dL Final         Passed - Patient is not pregnant      Passed - Valid encounter within last 12 months    Recent Outpatient Visits           2 months ago Encounter for [employer] annual wellness exam   [facility], Dionne Bucy, MD   9 months ago Primary hypertension   [facility], Dionne Bucy, MD   1 year ago Flatulence/gas [place], Dionne Bucy, MD   1 year ago Encounter for annual physical exam   [facility] [place], Dionne Bucy, MD   1 year ago Essential hypertension   [place], Dionne Bucy, MD       Future Appointments             In 3 months Bacig[place], Dionne Bucy, MD [facility], [place]

## 2021-11-18 DIAGNOSIS — K743 Primary biliary cirrhosis: Secondary | ICD-10-CM | POA: Diagnosis not present

## 2021-11-18 DIAGNOSIS — M159 Polyosteoarthritis, unspecified: Secondary | ICD-10-CM | POA: Diagnosis not present

## 2021-11-18 DIAGNOSIS — M349 Systemic sclerosis, unspecified: Secondary | ICD-10-CM | POA: Diagnosis not present

## 2021-11-23 ENCOUNTER — Encounter: Payer: Self-pay | Admitting: Family Medicine

## 2021-12-27 ENCOUNTER — Ambulatory Visit (INDEPENDENT_AMBULATORY_CARE_PROVIDER_SITE_OTHER): Payer: Medicare PPO | Admitting: Gastroenterology

## 2021-12-27 ENCOUNTER — Encounter: Payer: Self-pay | Admitting: Gastroenterology

## 2021-12-27 VITALS — BP 166/74 | HR 97 | Temp 97.9°F | Ht 66.0 in | Wt 161.0 lb

## 2021-12-27 DIAGNOSIS — K743 Primary biliary cirrhosis: Secondary | ICD-10-CM | POA: Diagnosis not present

## 2021-12-27 MED ORDER — URSODIOL 500 MG PO TABS: ORAL_TABLET | ORAL | 3 refills | Status: DC

## 2021-12-27 NOTE — Progress Notes (Signed)
? ? ?Primary Care Physician: Virginia Crews, MD ? ?Primary Gastroenterologist:  Dr. Lucilla Lame ? ?Chief Complaint  ?Patient presents with  ? Follow-up  ?  Primary biliary cirrhosis  ? Medication Refill  ? ? ?HPI: Angela Valenzuela is a 71 y.o. female here for follow-up with a history of positive antimitochondrial antibody and the patient has been treated for PBC.  The patient's most recent lab work has shown normal liver enzymes. ?The patient reports that she has lost weight by changing her diet and has no issues at the present time.  She denies any abdominal pain dark urine or jaundice ? ?Past Medical History:  ?Diagnosis Date  ? Allergy   ? Cataract   ? Hyperlipidemia   ? Hypertension   ? Increased liver enzymes 03/17/2015  ? Paroxysmal digital cyanosis 03/17/2015  ? Overview:   a.  Positive anticentromere antibody b.   Sicca-xerostomia c.  RVSP 41 in 2013   ? Raynaud's disease 04/30/2015  ? Thyroid disease   ? ? ?Current Outpatient Medications  ?Medication Sig Dispense Refill  ? amLODipine (NORVASC) 5 MG tablet TAKE 1 AND 1/2 TABLETS BY MOUTH EVERY DAY 135 tablet 1  ? calcium carbonate (TUMS EX) 750 MG chewable tablet Chew 1 tablet by mouth daily.    ? Calcium-Vitamin D 600-200 MG-UNIT per tablet Take 1 tablet by mouth daily.     ? diclofenac Sodium (VOLTAREN) 1 % GEL Apply 1 application topically daily. As needed    ? Lifitegrast (XIIDRA) 5 % SOLN Apply 2 drops to eye daily.    ? Omega-3 Fatty Acids (OMEGA-3 FISH OIL PO) Take by mouth.    ? PREVIDENT 5000 DRY MOUTH 1.1 % GEL dental gel USE AS DIRECTED 1 TIME DAILY  3  ? simvastatin (ZOCOR) 20 MG tablet TAKE 1 TABLET BY MOUTH EVERYDAY AT BEDTIME 90 tablet 1  ? ursodiol (ACTIGALL) 500 MG tablet TAKE 1 TABLET BY MOUTH 3 (THREE) TIMES DAILY. **PLEASE SCHEDULE FOLLOW UP APPT** 270 tablet 1  ? ?No current facility-administered medications for this visit.  ? ? ?Allergies as of 12/27/2021  ? (No Known Allergies)  ? ? ?ROS: ? ?General: Negative for anorexia, weight  loss, fever, chills, fatigue, weakness. ?ENT: Negative for hoarseness, difficulty swallowing , nasal congestion. ?CV: Negative for chest pain, angina, palpitations, dyspnea on exertion, peripheral edema.  ?Respiratory: Negative for dyspnea at rest, dyspnea on exertion, cough, sputum, wheezing.  ?GI: See history of present illness. ?GU:  Negative for dysuria, hematuria, urinary incontinence, urinary frequency, nocturnal urination.  ?Endo: Negative for unusual weight change.  ?  ?Physical Examination: ? ? BP (!) 166/74   Pulse 97   Temp 97.9 ?F (36.6 ?C) (Oral)   Ht 5\' 6"  (1.676 m)   Wt 161 lb (73 kg)   BMI 25.99 kg/m?  ? ?General: Well-nourished, well-developed in no acute distress.  ?Eyes: No icterus. Conjunctivae pink. ?Abdomen: Bowel sounds are normal, nontender, nondistended, no hepatosplenomegaly or masses, no abdominal bruits or hernia , no rebound or guarding.   ?Extremities: No lower extremity edema. No clubbing or deformities. ?Neuro: Alert and oriented x 3.  Grossly intact. ?Skin: Warm and dry, no jaundice.   ?Psych: Alert and cooperative, normal mood and affect. ? ?Labs:  ?  ?Imaging Studies: ?No results found. ? ?Assessment and Plan:  ? ?Angela Valenzuela is a 71 y.o. y/o female who comes in today with a history of PBC with normal liver enzymes while on ursodiol.  The patient  had her dose recalculated due to her weight loss and has been told that she can taking the medication twice a day instead of 3 times a day and see how her liver enzymes respond.  The patient has been explained the plan agrees with it. ? ? ? ? ?Lucilla Lame, MD. Marval Regal ? ? ? Note: This dictation was prepared with Dragon dictation along with smaller phrase technology. Any transcriptional errors that result from this process are unintentional.  ?

## 2022-01-27 ENCOUNTER — Encounter: Payer: Self-pay | Admitting: Family Medicine

## 2022-01-27 DIAGNOSIS — R739 Hyperglycemia, unspecified: Secondary | ICD-10-CM

## 2022-01-27 DIAGNOSIS — E038 Other specified hypothyroidism: Secondary | ICD-10-CM

## 2022-01-27 DIAGNOSIS — I10 Essential (primary) hypertension: Secondary | ICD-10-CM

## 2022-01-27 NOTE — Telephone Encounter (Signed)
She needs CMP, A1c, TSH and free T4.  Have done at least 2 days before appt and we will discuss at the appt. Please let her know when they are ordered and she can come to get them.

## 2022-01-28 DIAGNOSIS — E038 Other specified hypothyroidism: Secondary | ICD-10-CM | POA: Diagnosis not present

## 2022-01-28 DIAGNOSIS — R739 Hyperglycemia, unspecified: Secondary | ICD-10-CM | POA: Diagnosis not present

## 2022-01-28 DIAGNOSIS — I10 Essential (primary) hypertension: Secondary | ICD-10-CM | POA: Diagnosis not present

## 2022-01-29 LAB — COMPREHENSIVE METABOLIC PANEL WITH GFR
ALT: 17 IU/L (ref 0–32)
AST: 18 IU/L (ref 0–40)
Albumin/Globulin Ratio: 2.8 — ABNORMAL HIGH (ref 1.2–2.2)
Albumin: 4.5 g/dL (ref 3.7–4.7)
Alkaline Phosphatase: 100 IU/L (ref 44–121)
BUN/Creatinine Ratio: 16 (ref 12–28)
BUN: 11 mg/dL (ref 8–27)
Bilirubin Total: 0.3 mg/dL (ref 0.0–1.2)
CO2: 22 mmol/L (ref 20–29)
Calcium: 9.1 mg/dL (ref 8.7–10.3)
Chloride: 106 mmol/L (ref 96–106)
Creatinine, Ser: 0.68 mg/dL (ref 0.57–1.00)
Globulin, Total: 1.6 g/dL (ref 1.5–4.5)
Glucose: 91 mg/dL (ref 70–99)
Potassium: 4.1 mmol/L (ref 3.5–5.2)
Sodium: 143 mmol/L (ref 134–144)
Total Protein: 6.1 g/dL (ref 6.0–8.5)
eGFR: 93 mL/min/1.73

## 2022-01-29 LAB — TSH+FREE T4
Free T4: 1.34 ng/dL (ref 0.82–1.77)
TSH: 3.72 u[IU]/mL (ref 0.450–4.500)

## 2022-01-29 LAB — HEMOGLOBIN A1C
Est. average glucose Bld gHb Est-mCnc: 117 mg/dL
Hgb A1c MFr Bld: 5.7 % — ABNORMAL HIGH (ref 4.8–5.6)

## 2022-02-02 NOTE — Progress Notes (Signed)
I,[name],acting as a scribe for Angela Paganini, MD.,have documented all relevant documentation on the behalf of Angela Paganini, MD,as directed by  Angela Paganini, MD while in the presence of Angela Paganini, MD.  Established patient visit   Patient: Angela Valenzuela   DOB: 1951/04/04   71 y.o. Female  MRN: [id number] Visit Date: 02/04/2022  Today's healthcare provider: [name], MD   Chief Complaint  Patient presents with   follow-up chronic disease   Subjective    HPI  Hypertension, follow-up  BP Readings from Last 3 Encounters:  02/04/22 131/78  12/27/21 (!) 166/74  08/06/21 127/76   Wt Readings from Last 3 Encounters:  02/04/22 158 lb 8 oz (71.9 kg)  12/27/21 161 lb (73 kg)  08/06/21 166 lb (75.3 kg)     She was last seen for hypertension 6 months ago.  BP at that visit was 127/76. Management since that visit includes no changes.  She reports excellent compliance with treatment. She is not having side effects.  She is following a Regular diet. She is exercising. She does not smoke.  Use of agents associated with hypertension: none.   Outside blood pressures are 130'S-120'S/60'S-70. Symptoms: No chest pain No chest pressure  No palpitations No syncope  No dyspnea No orthopnea  No paroxysmal nocturnal dyspnea Yes lower extremity edema   Pertinent labs Lab Results  Component Value Date   CHOL 186 08/06/2021   HDL 106 08/06/2021   LDLCALC 69 08/06/2021   TRIG 54 08/06/2021   CHOLHDL 1.8 08/06/2021   Lab Results  Component Value Date   NA 143 01/28/2022   K 4.1 01/28/2022   CREATININE 0.68 01/28/2022   EGFR 93 01/28/2022   GLUCOSE 91 01/28/2022   TSH 3.720 01/28/2022     The ASCVD Risk score (Arnett DK, et al., 2019) failed to calculate for the following reasons:   The valid HDL cholesterol range is 20 to 100  mg/dL  --------------------------------------------------------------------------------------------------- Hypothyroid, follow-up  Lab Results  Component Value Date   TSH 3.720 01/28/2022   TSH 2.060 08/06/2021   TSH 3.400 07/31/2020   FREET4 1.34 01/28/2022   FREET4 1.52 08/06/2021    Wt Readings from Last 3 Encounters:  02/04/22 158 lb 8 oz (71.9 kg)  12/27/21 161 lb (73 kg)  08/06/21 166 lb (75.3 kg)    She was last seen for hypothyroid 6 months ago.  Management since that visit includes no changes. She reports excellent compliance with treatment. She is not having side effects.   Symptoms: No change in energy level No constipation  No diarrhea No heat / cold intolerance  No nervousness No palpitations  No weight changes    ----------------------------------------------------------------------------------------- Lipid/Cholesterol, Follow-up  Last lipid panel Other pertinent labs  Lab Results  Component Value Date   CHOL 186 08/06/2021   HDL 106 08/06/2021   LDLCALC 69 08/06/2021   TRIG 54 08/06/2021   CHOLHDL 1.8 08/06/2021   Lab Results  Component Value Date   ALT 17 01/28/2022   AST 18 01/28/2022   PLT 221 07/26/2019   TSH 3.720 01/28/2022     She was last seen for this 6 months ago.  Management since that visit includes no changes.  She reports excellent compliance with treatment. She is not having side effects.   Symptoms: No chest pain No chest pressure/discomfort  No dyspnea Yes lower extremity edema  No numbness or tingling of extremity No orthopnea  No palpitations No  paroxysmal nocturnal dyspnea  No speech difficulty No syncope    The ASCVD Risk score (Arnett DK, et al., 2019) failed to calculate for the following reasons:   The valid HDL cholesterol range is 20 to 100 mg/dL  --------------------------------------------------------------------------------------------------- Occasional pain in L deltoid - mostly better since carrying a  lighter purse.  Maybe overuse of tendon. Consider sports med in the future  R ear pain right at start of canal - intermittent  Medications: Outpatient Medications Prior to Visit  Medication Sig   Calcium-Vitamin D 600-200 MG-UNIT per tablet Take 1 tablet by mouth daily.    diclofenac Sodium (VOLTAREN) 1 % GEL Apply 1 application topically daily. As needed   Lifitegrast (XIIDRA) 5 % SOLN Apply 2 drops to eye daily.   Omega-3 Fatty Acids (OMEGA-3 FISH OIL PO) Take by mouth.   PREVIDENT 5000 DRY MOUTH 1.1 % GEL dental gel USE AS DIRECTED 1 TIME DAILY   simvastatin (ZOCOR) 20 MG tablet TAKE 1 TABLET BY MOUTH EVERYDAY AT BEDTIME   ursodiol (ACTIGALL) 500 MG tablet TAKE 1 TABLET BY MOUTH 3 (THREE) TIMES DAILY (Patient taking differently: TAKE 1 TABLET BY MOUTH 2 (TWO) TIMES DAILY)   [DISCONTINUED] amLODipine (NORVASC) 5 MG tablet TAKE 1 AND 1/2 TABLETS BY MOUTH EVERY DAY   calcium carbonate (TUMS EX) 750 MG chewable tablet Chew 1 tablet by mouth daily.   No facility-administered medications prior to visit.    Review of Systems per HPI      Objective    BP 131/78 (BP Location: Left Arm, Patient Position: Sitting, Cuff Size: Normal)   Pulse 80   Temp 98.5 F (36.9 C) (Oral)   Resp 16   Wt 158 lb 8 oz (71.9 kg)   BMI 25.58 kg/m  BP Readings from Last 3 Encounters:  02/04/22 131/78  12/27/21 (!) 166/74  08/06/21 127/76   Wt Readings from Last 3 Encounters:  02/04/22 158 lb 8 oz (71.9 kg)  12/27/21 161 lb (73 kg)  08/06/21 166 lb (75.3 kg)      Physical Exam Vitals reviewed.  Constitutional:      General: She is not in acute distress.    Appearance: Normal appearance. She is well-developed. She is not diaphoretic.  HENT:     Head: Normocephalic and atraumatic.  Eyes:     General: No scleral icterus.    Conjunctiva/sclera: Conjunctivae normal.  Neck:     Thyroid: No thyromegaly.  Cardiovascular:     Rate and Rhythm: Normal rate and regular rhythm.     Pulses: Normal  pulses.     Heart sounds: Normal heart sounds. No murmur heard. Pulmonary:     Effort: Pulmonary effort is normal. No respiratory distress.     Breath sounds: Normal breath sounds. No wheezing, rhonchi or rales.  Musculoskeletal:     Cervical back: Neck supple.     Right lower leg: Edema present.     Left lower leg: Edema present.  Lymphadenopathy:     Cervical: No cervical adenopathy.  Skin:    General: Skin is warm and dry.     Findings: No rash.  Neurological:     Mental Status: She is alert and oriented to person, place, and time. Mental status is at baseline.  Psychiatric:        Mood and Affect: Mood normal.        Behavior: Behavior normal.      No results found for any visits on 02/04/22.  Assessment & Plan  Problem List Items Addressed This Visit       Cardiovascular and Mediastinum   Hypertension - Primary    Well controlled Getting more LE edema with amlodipine Will switch to HCTZ 12.51m daily Recheck metabolic panel F/u in 1 months       Relevant Medications   hydrochlorothiazide (HYDRODIURIL) 12.5 MG tablet   Other Relevant Orders   Basic Metabolic Panel (BMET)     Digestive   Primary biliary cholangitis (HCC)    Well controlled Continue to monitor LFTs Continue ursodiol 5048m         Endocrine   Subclinical hypothyroidism    Asymptomatic  Normal labs Continue to monitor         Other   Hyperlipidemia    Previously well controlled Continue statin Reviewed last FLP and CMP      Relevant Medications   hydrochlorothiazide (HYDRODIURIL) 12.5 MG tablet   Prediabetes    Recommend low carb diet and exercise Stable A1c        Return in about 4 weeks (around 03/04/2022) for BP f/u.      I, AnLavon PaganiniMD, have reviewed all documentation for this visit. The documentation on 02/04/22 for the exam, diagnosis, procedures, and orders are all accurate and complete.   Kawthar Ennen, AnDionne BucyMD, MPH [facility]

## 2022-02-04 ENCOUNTER — Encounter: Payer: Self-pay | Admitting: Family Medicine

## 2022-02-04 ENCOUNTER — Ambulatory Visit: Payer: Medicare PPO | Admitting: Family Medicine

## 2022-02-04 VITALS — BP 131/78 | HR 80 | Temp 98.5°F | Resp 16 | Wt 158.5 lb

## 2022-02-04 DIAGNOSIS — E782 Mixed hyperlipidemia: Secondary | ICD-10-CM

## 2022-02-04 DIAGNOSIS — I10 Essential (primary) hypertension: Secondary | ICD-10-CM | POA: Diagnosis not present

## 2022-02-04 DIAGNOSIS — E038 Other specified hypothyroidism: Secondary | ICD-10-CM | POA: Diagnosis not present

## 2022-02-04 DIAGNOSIS — K743 Primary biliary cirrhosis: Secondary | ICD-10-CM

## 2022-02-04 DIAGNOSIS — R7303 Prediabetes: Secondary | ICD-10-CM | POA: Insufficient documentation

## 2022-02-04 MED ORDER — HYDROCHLOROTHIAZIDE 12.5 MG PO TABS: 12.5000 mg | ORAL_TABLET | Freq: Every day | ORAL | 3 refills | Status: DC

## 2022-02-04 NOTE — Assessment & Plan Note (Signed)
Well controlled Continue to monitor LFTs Continue ursodiol 500mg 

## 2022-02-04 NOTE — Assessment & Plan Note (Addendum)
Asymptomatic  Normal labs Continue to monitor

## 2022-02-04 NOTE — Assessment & Plan Note (Addendum)
Previously well controlled Continue statin Reviewed last FLP and CMP 

## 2022-02-04 NOTE — Assessment & Plan Note (Addendum)
Well controlled Getting more LE edema with amlodipine Will switch to HCTZ 12.5mg  daily Recheck metabolic panel F/u in 1 months

## 2022-02-04 NOTE — Assessment & Plan Note (Addendum)
Recommend low carb diet and exercise Stable A1c

## 2022-02-16 DIAGNOSIS — H43812 Vitreous degeneration, left eye: Secondary | ICD-10-CM | POA: Diagnosis not present

## 2022-02-16 DIAGNOSIS — H04123 Dry eye syndrome of bilateral lacrimal glands: Secondary | ICD-10-CM | POA: Diagnosis not present

## 2022-02-16 DIAGNOSIS — Z961 Presence of intraocular lens: Secondary | ICD-10-CM | POA: Diagnosis not present

## 2022-02-24 DIAGNOSIS — D225 Melanocytic nevi of trunk: Secondary | ICD-10-CM | POA: Diagnosis not present

## 2022-02-24 DIAGNOSIS — D2262 Melanocytic nevi of left upper limb, including shoulder: Secondary | ICD-10-CM | POA: Diagnosis not present

## 2022-02-24 DIAGNOSIS — L821 Other seborrheic keratosis: Secondary | ICD-10-CM | POA: Diagnosis not present

## 2022-02-24 DIAGNOSIS — Z85828 Personal history of other malignant neoplasm of skin: Secondary | ICD-10-CM | POA: Diagnosis not present

## 2022-02-24 DIAGNOSIS — D2261 Melanocytic nevi of right upper limb, including shoulder: Secondary | ICD-10-CM | POA: Diagnosis not present

## 2022-02-28 ENCOUNTER — Other Ambulatory Visit: Payer: Self-pay | Admitting: Family Medicine

## 2022-03-01 DIAGNOSIS — I10 Essential (primary) hypertension: Secondary | ICD-10-CM | POA: Diagnosis not present

## 2022-03-01 NOTE — Progress Notes (Signed)
I,Angela Valenzuela,acting as a scribe for Angela Paganini, MD.,have documented all relevant documentation on the behalf of Angela Paganini, MD,as directed by  Angela Paganini, MD while in the presence of Angela Paganini, MD.   Established patient visit   Patient: Angela Valenzuela   DOB: 10-18-1950   71 y.o. Female  MRN: [id number] Visit Date: 03/04/2022  Today's healthcare provider: [name], MD   Chief Complaint  Patient presents with   Follow-Up BP   Subjective    HPI  Hypertension, follow-up  BP Readings from Last 3 Encounters:  03/04/22 131/80  02/04/22 131/78  12/27/21 (!) 166/74   Wt Readings from Last 3 Encounters:  03/04/22 157 lb 1.6 oz (71.3 kg)  02/04/22 158 lb 8 oz (71.9 kg)  12/27/21 161 lb (73 kg)     She was last seen for hypertension 1 months ago.  BP at that visit was 131/78. Management since that visit includes Will switch to HCTZ 12.36m daily.  She reports excellent compliance with treatment. She is not having side effects.  She is following a Regular diet. She is exercising. She does not smoke.  Outside blood pressures are 110-130/60-80. Symptoms: No chest pain No chest pressure  No palpitations No syncope  No dyspnea No orthopnea  No paroxysmal nocturnal dyspnea No lower extremity edema   Pertinent labs Lab Results  Component Value Date   CHOL 186 08/06/2021   HDL 106 08/06/2021   LDLCALC 69 08/06/2021   TRIG 54 08/06/2021   CHOLHDL 1.8 08/06/2021   Lab Results  Component Value Date   NA 145 (H) 03/01/2022   K 4.2 03/01/2022   CREATININE 0.65 03/01/2022   EGFR 94 03/01/2022   GLUCOSE 91 03/01/2022   TSH 3.720 01/28/2022     The ASCVD Risk score (Arnett DK, et al., 2019) failed to calculate for the following reasons:   The valid HDL cholesterol range is 20 to 100 mg/dL  ---------------------------------------------------------------------------  Medications: Outpatient Medications Prior to Visit  Medication  Sig   calcium carbonate (TUMS EX) 750 MG chewable tablet Chew 1 tablet by mouth daily.   Calcium-Vitamin D 600-200 MG-UNIT per tablet Take 1 tablet by mouth daily.    diclofenac Sodium (VOLTAREN) 1 % GEL Apply 1 application topically daily. As needed   hydrochlorothiazide (HYDRODIURIL) 12.5 MG tablet Take 1 tablet (12.5 mg total) by mouth daily.   Lifitegrast (XIIDRA) 5 % SOLN Apply 2 drops to eye daily.   Omega-3 Fatty Acids (OMEGA-3 FISH OIL PO) Take by mouth.   PREVIDENT 5000 DRY MOUTH 1.1 % GEL dental gel USE AS DIRECTED 1 TIME DAILY   simvastatin (ZOCOR) 20 MG tablet TAKE 1 TABLET BY MOUTH EVERYDAY AT BEDTIME   ursodiol (ACTIGALL) 500 MG tablet TAKE 1 TABLET BY MOUTH 3 (THREE) TIMES DAILY (Patient taking differently: TAKE 1 TABLET BY MOUTH 2 (TWO) TIMES DAILY)   No facility-administered medications prior to visit.    Review of Systems  Constitutional:  Negative for appetite change, chills, fatigue and fever.  Respiratory:  Negative for chest tightness and shortness of breath.   Cardiovascular:  Negative for chest pain and palpitations.  Gastrointestinal:  Negative for abdominal pain, nausea and vomiting.  Neurological:  Negative for dizziness and weakness.       Objective    BP 131/80 (BP Location: Left Arm, Patient Position: Sitting, Cuff Size: Normal)   Pulse 78   Temp 98.6 F (37 C) (Oral)   Resp 16  Wt 157 lb 1.6 oz (71.3 kg)   BMI 25.36 kg/m    Physical Exam Vitals reviewed.  Constitutional:      General: She is not in acute distress.    Appearance: Normal appearance. She is well-developed. She is not diaphoretic.  HENT:     Head: Normocephalic and atraumatic.  Eyes:     General: No scleral icterus.    Conjunctiva/sclera: Conjunctivae normal.  Neck:     Thyroid: No thyromegaly.  Cardiovascular:     Rate and Rhythm: Normal rate and regular rhythm.     Pulses: Normal pulses.     Heart sounds: Normal heart sounds. No murmur heard. Pulmonary:     Effort:  Pulmonary effort is normal. No respiratory distress.     Breath sounds: Normal breath sounds. No wheezing, rhonchi or rales.  Musculoskeletal:     Cervical back: Neck supple.     Right lower leg: No edema.     Left lower leg: No edema.  Lymphadenopathy:     Cervical: No cervical adenopathy.  Skin:    General: Skin is warm and dry.  Neurological:     Mental Status: She is alert and oriented to person, place, and time. Mental status is at baseline.  Psychiatric:        Mood and Affect: Mood normal.        Behavior: Behavior normal.       No results found for any visits on 03/04/22.  Assessment & Plan     Problem List Items Addressed This Visit       Cardiovascular and Mediastinum   Hypertension    Well controlled Continue current medications LE edema resolved since switching from amlodipine to HCTZ Reviewed recent metabolic panel        Return in about 5 months (around 08/04/2022) for CPE, as scheduled.      I, Angela Bacigalupo, MD, have reviewed all documentation for this visit. The documentation on 03/04/22 for the exam, diagnosis, procedures, and orders are all accurate and complete.   Bacigalupo, Angela M, MD, MPH [facility]   

## 2022-03-02 LAB — BASIC METABOLIC PANEL
BUN/Creatinine Ratio: 20 (ref 12–28)
BUN: 13 mg/dL (ref 8–27)
CO2: 23 mmol/L (ref 20–29)
Calcium: 9.4 mg/dL (ref 8.7–10.3)
Chloride: 105 mmol/L (ref 96–106)
Creatinine, Ser: 0.65 mg/dL (ref 0.57–1.00)
Glucose: 91 mg/dL (ref 70–99)
Potassium: 4.2 mmol/L (ref 3.5–5.2)
Sodium: 145 mmol/L — ABNORMAL HIGH (ref 134–144)
eGFR: 94 mL/min/{1.73_m2} (ref >59)

## 2022-03-04 ENCOUNTER — Ambulatory Visit: Payer: Medicare PPO | Admitting: Family Medicine

## 2022-03-04 ENCOUNTER — Encounter: Payer: Self-pay | Admitting: Family Medicine

## 2022-03-04 DIAGNOSIS — I10 Essential (primary) hypertension: Secondary | ICD-10-CM | POA: Diagnosis not present

## 2022-03-04 NOTE — Assessment & Plan Note (Signed)
Well controlled Continue current medications LE edema resolved since switching from amlodipine to HCTZ Reviewed recent metabolic panel

## 2022-03-24 DIAGNOSIS — D0471 Carcinoma in situ of skin of right lower limb, including hip: Secondary | ICD-10-CM | POA: Diagnosis not present

## 2022-03-24 DIAGNOSIS — R208 Other disturbances of skin sensation: Secondary | ICD-10-CM | POA: Diagnosis not present

## 2022-03-24 DIAGNOSIS — D485 Neoplasm of uncertain behavior of skin: Secondary | ICD-10-CM | POA: Diagnosis not present

## 2022-04-22 ENCOUNTER — Other Ambulatory Visit: Payer: Self-pay | Admitting: Family Medicine

## 2022-04-22 NOTE — Telephone Encounter (Signed)
Requested Prescriptions  Pending Prescriptions Disp Refills  . simvastatin (ZOCOR) 20 MG tablet [Pharmacy Med Name: SIMVASTATIN 20 MG TABLET] 90 tablet 1    Sig: TAKE 1 TABLET BY MOUTH EVERYDAY AT BEDTIME     Cardiovascular:  Antilipid - Statins Failed - 04/22/2022  2:24 AM      Failed - Lipid Panel in normal range within the last 12 months    Cholesterol, Total  Date Value Ref Range Status  08/06/2021 186 100 - 199 mg/dL Final   LDL Chol Calc (NIH)  Date Value Ref Range Status  08/06/2021 69 0 - 99 mg/dL Final   HDL  Date Value Ref Range Status  08/06/2021 106 >39 mg/dL Final   Triglycerides  Date Value Ref Range Status  08/06/2021 54 0 - 149 mg/dL Final         Passed - Patient is not pregnant      Passed - Valid encounter within last 12 months    Recent Outpatient Visits          1 month ago Primary hypertension   [facility] Bacigalupo, Marzella Schlein, MD   2 months ago Primary hypertension   [facility] [place], Marzella Schlein, MD   8 months ago Encounter for [employer] annual wellness exam   [facility], Marzella Schlein, MD   1 year ago Primary hypertension   [facility], Marzella Schlein, MD   1 year ago Flatulence/gas pain/belching   [facility], Marzella Schlein, MD

## 2022-05-04 ENCOUNTER — Ambulatory Visit: Payer: Self-pay

## 2022-05-04 NOTE — Telephone Encounter (Signed)
Summary: pimple (bump) on her vagina   Pt states a pimple (bump) like looking thing on her vagina has gotten a little bigger. Pt stated has had it for a while has noticed between Monday to today that its bigger. Pt also mentioned she saw a purple spot as well a little way apart from the bump.   No pain.   Pt seeking clinical advice.     Called pt and LM on VM to call back to  [phone].

## 2022-05-04 NOTE — Telephone Encounter (Signed)
Summary: pimple (bump) on her vagina   Pt states a pimple (bump) like looking thing on her vagina has gotten a little bigger. Pt stated has had it for a while has noticed between Monday to today that its bigger. Pt also mentioned she saw a purple spot as well a little way apart from the bump.   No pain.   Pt seeking clinical advice.     Called pt - [id number].

## 2022-05-04 NOTE — Progress Notes (Deleted)
      Established patient visit   Patient: Angela Valenzuela   DOB: 1951/04/04   71 y.o. Female  MRN: [id number] Visit Date: 05/06/2022  Today's healthcare provider: Jacky Kindle, FNP   No chief complaint on file.  Subjective    HPI  ***  Medications: Outpatient Medications Prior to Visit  Medication Sig   calcium carbonate (TUMS EX) 750 MG chewable tablet Chew 1 tablet by mouth daily.   Calcium-Vitamin D 600-200 MG-UNIT per tablet Take 1 tablet by mouth daily.    diclofenac Sodium (VOLTAREN) 1 % GEL Apply 1 application topically daily. As needed   hydrochlorothiazide (HYDRODIURIL) 12.5 MG tablet Take 1 tablet (12.5 mg total) by mouth daily.   Lifitegrast (XIIDRA) 5 % SOLN Apply 2 drops to eye daily.   Omega-3 Fatty Acids (OMEGA-3 FISH OIL PO) Take by mouth.   PREVIDENT 5000 DRY MOUTH 1.1 % GEL dental gel USE AS DIRECTED 1 TIME DAILY   simvastatin (ZOCOR) 20 MG tablet TAKE 1 TABLET BY MOUTH EVERYDAY AT BEDTIME   ursodiol (ACTIGALL) 500 MG tablet TAKE 1 TABLET BY MOUTH 3 (THREE) TIMES DAILY (Patient taking differently: TAKE 1 TABLET BY MOUTH 2 (TWO) TIMES DAILY)   No facility-administered medications prior to visit.    Review of Systems  {Labs  Heme  Chem  Endocrine  Serology  Results Review (optional):23779}   Objective    There were no vitals taken for this visit. {Show previous vital signs (optional):23777}  Physical Exam  ***  No results found for any visits on 05/06/22.  Assessment & Plan     ***  No follow-ups on file.      {provider attestation***:1}   Jacky Kindle, FNP  [facility] [phone] (phone) [phone] (fax)  [facility]

## 2022-05-04 NOTE — Telephone Encounter (Signed)
  Chief Complaint: lump on vagina Symptoms: small less than pea size area noted on vaginal area towards back. Purple area noted above lump. No pain , no drainage,no redness. Itching approx1 month ago in area no itching now.  Frequency: noted lump since 05/02/22 Pertinent Negatives: Patient denies fever, no drainage no redness no other lumps noted Disposition: [] ED /[] Urgent Care (no appt availability in office) / [x] Appointment(In office/virtual)/ []  [facility]/ [] Home Care/ [] Refused Recommended Disposition /[] [facility]/ []  Follow-up with PCP Additional Notes:   Scheduled appt for 05/06/22. Patient requesting if earlier appt please call. Patient on wait list.     Reason for Disposition  [1] Small swelling or lump AND [2] unexplained AND [3] present > 1 week  Answer Assessment - Initial Assessment Questions 1. APPEARANCE of SWELLING: "What does it look like?"     Lump on vagina area towards back purple area above lump 2. SIZE: "How large is the swelling?" (e.g., inches, cm; or compare to size of pinhead, tip of pen, eraser, coin, pea, grape, ping pong ball)      Small pea 3. LOCATION: "Where is the swelling located?"     No  4. ONSET: "When did the swelling start?"     Yes  5. COLOR: "What color is it?" "Is there more than one color?"     Same color as skin above area purple spot  6. PAIN: "Is there any pain?" If Yes, ask: "How bad is the pain?" (e.g., scale 1-10; or mild, moderate, severe)     - NONE (0): no pain   - MILD (1-3): doesn't interfere with normal activities    - MODERATE (4-7): interferes with normal activities or awakens from sleep    - SEVERE (8-10): excruciating pain, unable to do any normal activities     Denies pain 7. ITCH: "Does it itch?" If Yes, ask: "How bad is the itch?"      Yes when it first started approx 1 month ago  8. CAUSE: "What do you think caused the swelling?" Not sure  9 OTHER SYMPTOMS: "Do you have any other symptoms?"  (e.g., fever)     na  Protocols used: Skin Lump or Localized Swelling-A-AH

## 2022-05-05 ENCOUNTER — Ambulatory Visit (INDEPENDENT_AMBULATORY_CARE_PROVIDER_SITE_OTHER): Payer: Medicare PPO | Admitting: Physician Assistant

## 2022-05-05 ENCOUNTER — Encounter: Payer: Self-pay | Admitting: Physician Assistant

## 2022-05-05 VITALS — BP 160/88 | HR 90 | Temp 98.4°F | Resp 16 | Wt 155.5 lb

## 2022-05-05 DIAGNOSIS — R1909 Other intra-abdominal and pelvic swelling, mass and lump: Secondary | ICD-10-CM

## 2022-05-05 NOTE — Progress Notes (Unsigned)
I,Angela Valenzuela,acting as a Neurosurgeon for [employer], PA-C.,have documented all relevant documentation on the behalf of Angela Lat, PA-C,as directed by  [employer], PA-C while in the presence of [employer], PA-C.   Established patient visit   Patient: Angela Valenzuela   DOB: [birth year]   71 y.o. Female  MRN: [id number] Visit Date: 05/05/2022  Today's healthcare provider: Debera Lat, PA-C   CC: lump on vaginal area  Subjective    Angela Valenzuela is a 71 yr old female presenting for lump on vaginal area.  Reports lump is small and less than pea size with purple area noted above lump. Patient reports she has had lump for a while and noticing it's getting bigger.   No pain, drainage, redness.    Medications: Outpatient Medications Prior to Visit  Medication Sig   calcium carbonate (TUMS EX) 750 MG chewable tablet Chew 1 tablet by mouth daily.   Calcium-Vitamin D 600-200 MG-UNIT per tablet Take 1 tablet by mouth daily.    diclofenac Sodium (VOLTAREN) 1 % GEL Apply 1 application topically daily. As needed   hydrochlorothiazide (HYDRODIURIL) 12.5 MG tablet Take 1 tablet (12.5 mg total) by mouth daily.   Omega-3 Fatty Acids (OMEGA-3 FISH OIL PO) Take by mouth.   PREVIDENT 5000 DRY MOUTH 1.1 % GEL dental gel USE AS DIRECTED 1 TIME DAILY   simvastatin (ZOCOR) 20 MG tablet TAKE 1 TABLET BY MOUTH EVERYDAY AT BEDTIME   ursodiol (ACTIGALL) 500 MG tablet TAKE 1 TABLET BY MOUTH 3 (THREE) TIMES DAILY (Patient taking differently: TAKE 1 TABLET BY MOUTH 2 (TWO) TIMES DAILY)   Lifitegrast (XIIDRA) 5 % SOLN Apply 2 drops to eye daily. (Patient not taking: Reported on 05/05/2022)   No facility-administered medications prior to visit.    Review of Systems  All other systems reviewed and are negative. Except see HPI  {Labs  Heme  Chem  Endocrine  Serology  Results Review (optional):23779   Objective    BP (!) 160/88 (BP Location: Left Arm, Patient Position: Sitting, Cuff Size: Normal)    Pulse 90   Temp 98.4 F (36.9 C) (Oral)   Resp 16   Wt 155 lb 8 oz (70.5 kg)   SpO2 100%   BMI 25.10 kg/m  {Show previous vital signs (optional):23777}  Physical Exam Genitourinary:    General: Normal vulva.     Rectum: Normal.     Comments: 4 small indurated cysts on the left vaginal area      No results found for any visits on 05/05/22.  Assessment & Plan     1. Lump in the groin *** Milia, inclusion cyst,  Advised to set up an appt for women's health physical with ObGYn   FU PRN     The patient was advised to call back or seek an in-person evaluation if the symptoms worsen or if the condition fails to improve as anticipated.  I discussed the assessment and treatment plan with the patient. The patient was provided an opportunity to ask questions and all were answered. The patient agreed with the plan and demonstrated an understanding of the instructions.  The entirety of the information documented in the History of Present Illness, Review of Systems and Physical Exam were personally obtained by me. Portions of this information were initially documented by the CMA and reviewed by me for thoroughness and accuracy.  Portions of this note were created using dictation software and may contain typographical errors.  Mardene Speak, PA-C  [facility] [phone] (phone) [phone] (fax)  [place]

## 2022-05-06 ENCOUNTER — Ambulatory Visit: Payer: Medicare PPO | Admitting: Family Medicine

## 2022-05-26 DIAGNOSIS — D0471 Carcinoma in situ of skin of right lower limb, including hip: Secondary | ICD-10-CM | POA: Diagnosis not present

## 2022-05-30 ENCOUNTER — Other Ambulatory Visit: Payer: Self-pay | Admitting: Family Medicine

## 2022-05-30 DIAGNOSIS — Z1231 Encounter for screening mammogram for malignant neoplasm of breast: Secondary | ICD-10-CM

## 2022-06-07 ENCOUNTER — Ambulatory Visit: Payer: Medicare PPO

## 2022-06-29 ENCOUNTER — Ambulatory Visit: Payer: Medicare PPO

## 2022-07-21 DIAGNOSIS — N907 Vulvar cyst: Secondary | ICD-10-CM | POA: Diagnosis not present

## 2022-07-26 ENCOUNTER — Ambulatory Visit
Admission: RE | Admit: 2022-07-26 | Discharge: 2022-07-26 | Disposition: A | Discharge status: Home / Self Care | Payer: Medicare PPO | Source: Ambulatory Visit | Attending: Family Medicine | Admitting: Family Medicine

## 2022-07-26 DIAGNOSIS — Z1231 Encounter for screening mammogram for malignant neoplasm of breast: Secondary | ICD-10-CM

## 2022-08-01 ENCOUNTER — Encounter: Payer: Self-pay | Admitting: Family Medicine

## 2022-08-01 DIAGNOSIS — I10 Essential (primary) hypertension: Secondary | ICD-10-CM

## 2022-08-01 DIAGNOSIS — R7303 Prediabetes: Secondary | ICD-10-CM

## 2022-08-01 DIAGNOSIS — E038 Other specified hypothyroidism: Secondary | ICD-10-CM

## 2022-08-01 DIAGNOSIS — E782 Mixed hyperlipidemia: Secondary | ICD-10-CM

## 2022-08-01 NOTE — Telephone Encounter (Signed)
Patient is due for lipids, cmp, tsh and free T4, A1c (use HLD, subclinical hypothyroid, prediabetes codes). Ok to order before her visit. She should have them drawn fasting at least 2 days before our appointment. We will then discuss the results at the upcoming visit.

## 2022-08-04 DIAGNOSIS — R7303 Prediabetes: Secondary | ICD-10-CM | POA: Diagnosis not present

## 2022-08-04 DIAGNOSIS — I10 Essential (primary) hypertension: Secondary | ICD-10-CM | POA: Diagnosis not present

## 2022-08-04 DIAGNOSIS — E782 Mixed hyperlipidemia: Secondary | ICD-10-CM | POA: Diagnosis not present

## 2022-08-04 DIAGNOSIS — E038 Other specified hypothyroidism: Secondary | ICD-10-CM | POA: Diagnosis not present

## 2022-08-05 LAB — COMPREHENSIVE METABOLIC PANEL
ALT: 14 IU/L (ref 0–32)
AST: 17 IU/L (ref 0–40)
Albumin/Globulin Ratio: 3 — ABNORMAL HIGH (ref 1.2–2.2)
Albumin: 4.8 g/dL (ref 3.8–4.8)
Alkaline Phosphatase: 96 IU/L (ref 44–121)
BUN/Creatinine Ratio: 16 (ref 12–28)
BUN: 13 mg/dL (ref 8–27)
Bilirubin Total: 0.4 mg/dL (ref 0.0–1.2)
CO2: 25 mmol/L (ref 20–29)
Calcium: 9.5 mg/dL (ref 8.7–10.3)
Chloride: 102 mmol/L (ref 96–106)
Creatinine, Ser: 0.81 mg/dL (ref 0.57–1.00)
Globulin, Total: 1.6 g/dL (ref 1.5–4.5)
Glucose: 97 mg/dL (ref 70–99)
Potassium: 3.9 mmol/L (ref 3.5–5.2)
Sodium: 139 mmol/L (ref 134–144)
Total Protein: 6.4 g/dL (ref 6.0–8.5)
eGFR: 78 mL/min/{1.73_m2} (ref >59)

## 2022-08-05 LAB — LIPID PANEL WITH LDL/HDL RATIO
Cholesterol, Total: 191 mg/dL (ref 100–199)
HDL: 115 mg/dL (ref >39)
LDL Chol Calc (NIH): 64 mg/dL (ref 0–99)
LDL/HDL Ratio: 0.6 ratio (ref 0.0–3.2)
Triglycerides: 63 mg/dL (ref 0–149)
VLDL Cholesterol Cal: 12 mg/dL (ref 5–40)

## 2022-08-05 LAB — HEMOGLOBIN A1C
Est. average glucose Bld gHb Est-mCnc: 114 mg/dL
Hgb A1c MFr Bld: 5.6 % (ref 4.8–5.6)

## 2022-08-05 LAB — T4 AND TSH
T4, Total: 9.6 ug/dL (ref 4.5–12.0)
TSH: 3.24 u[IU]/mL (ref 0.450–4.500)

## 2022-08-08 ENCOUNTER — Encounter: Payer: Medicare PPO | Admitting: Family Medicine

## 2022-08-08 NOTE — Progress Notes (Unsigned)
     I,Angela Valenzuela,acting as a Neurosurgeon for [employer], PA-C.,have documented all relevant documentation on the behalf of Angela Ferguson, PA-C,as directed by  Angela Ferguson, PA-C while in the presence of Angela Ferguson, PA-C.   Established patient visit   Patient: Angela Valenzuela   DOB: March 20, 1951   71 y.o. Female  MRN: [id number] Visit Date: 08/09/2022  Today's healthcare provider: [name], PA-C   No chief complaint on file.  Subjective    HPI  ***  Medications: Outpatient Medications Prior to Visit  Medication Sig   calcium carbonate (TUMS EX) 750 MG chewable tablet Chew 1 tablet by mouth daily.   Calcium-Vitamin D 600-200 MG-UNIT per tablet Take 1 tablet by mouth daily.    diclofenac Sodium (VOLTAREN) 1 % GEL Apply 1 application topically daily. As needed   hydrochlorothiazide (HYDRODIURIL) 12.5 MG tablet Take 1 tablet (12.5 mg total) by mouth daily.   Lifitegrast (XIIDRA) 5 % SOLN Apply 2 drops to eye daily. (Patient not taking: Reported on 05/05/2022)   Omega-3 Fatty Acids (OMEGA-3 FISH OIL PO) Take by mouth.   PREVIDENT 5000 DRY MOUTH 1.1 % GEL dental gel USE AS DIRECTED 1 TIME DAILY   simvastatin (ZOCOR) 20 MG tablet TAKE 1 TABLET BY MOUTH EVERYDAY AT BEDTIME   ursodiol (ACTIGALL) 500 MG tablet TAKE 1 TABLET BY MOUTH 3 (THREE) TIMES DAILY (Patient taking differently: TAKE 1 TABLET BY MOUTH 2 (TWO) TIMES DAILY)   No facility-administered medications prior to visit.    Review of Systems  {Labs  Heme  Chem  Endocrine  Serology  Results Review (optional):23779}   Objective    There were no vitals taken for this visit. {Show previous vital signs (optional):23777}  Physical Exam  ***  No results found for any visits on 08/09/22.  Assessment & Plan     ***  No follow-ups on file.      {provider attestation***:1}   Angela Ferguson, PA-C  [facility] [phone] (phone) [phone] (fax)  [facility]

## 2022-08-09 ENCOUNTER — Ambulatory Visit: Payer: Medicare PPO | Admitting: Physician Assistant

## 2022-08-09 ENCOUNTER — Encounter: Payer: Self-pay | Admitting: Physician Assistant

## 2022-08-09 VITALS — BP 140/71 | HR 97 | Ht 66.0 in | Wt 150.7 lb

## 2022-08-09 DIAGNOSIS — J069 Acute upper respiratory infection, unspecified: Secondary | ICD-10-CM

## 2022-08-31 NOTE — Progress Notes (Deleted)
I,Elianny Valenzuela S [name],acting as a Education administrator for Angela Paganini, MD.,have documented all relevant documentation on the behalf of Angela Paganini, MD,as directed by  Angela Paganini, MD while in the presence of Angela Paganini, MD.    Annual Wellness Visit     Patient: Angela Valenzuela, Female    DOB: Feb 07, 1951, 71 y.o.   MRN: [id number] Visit Date: 09/02/2022  Today's Provider: [name], MD   No chief complaint on file.  Subjective    ELYSE PREVO is a 71 y.o. female who presents today for her Annual Wellness Visit. She reports consuming a {diet types:17450} diet. {Exercise:19826} She generally feels {well/fairly well/poorly:18703}. She reports sleeping {well/fairly well/poorly:18703}. She {does/does not:200015} have additional problems to discuss today.   HPI   Medications: Outpatient Medications Prior to Visit  Medication Sig   calcium carbonate (TUMS EX) 750 MG chewable tablet Chew 1 tablet by mouth daily.   Calcium-Vitamin D 600-200 MG-UNIT per tablet Take 1 tablet by mouth daily.    diclofenac Sodium (VOLTAREN) 1 % GEL Apply 1 application topically daily. As needed   hydrochlorothiazide (HYDRODIURIL) 12.5 MG tablet Take 1 tablet (12.5 mg total) by mouth daily.   Lifitegrast (XIIDRA) 5 % SOLN Apply 2 drops to eye daily.   Omega-3 Fatty Acids (OMEGA-3 FISH OIL PO) Take by mouth.   PREVIDENT 5000 DRY MOUTH 1.1 % GEL dental gel USE AS DIRECTED 1 TIME DAILY   simvastatin (ZOCOR) 20 MG tablet TAKE 1 TABLET BY MOUTH EVERYDAY AT BEDTIME   ursodiol (ACTIGALL) 500 MG tablet TAKE 1 TABLET BY MOUTH 3 (THREE) TIMES DAILY (Patient taking differently: Take 500 mg by mouth in the morning and at bedtime. TAKE 1 TABLET BY MOUTH 2 (TWO) TIMES DAILY)   No facility-administered medications prior to visit.    No Known Allergies  Patient Care Team: Virginia Crews, MD as PCP - General (Family Medicine) Emmaline Kluver., MD (Rheumatology) Lucilla Lame, MD as  Consulting Physician (Gastroenterology) System, Provider Not In [place], Abundio Miu, MD as Referring Physician (Ophthalmology) Verner Chol, MD as Consulting Physician (Sports Medicine) Alda Berthold, DO as Consulting Physician (Neurology)  Review of Systems  All other systems reviewed and are negative.   Last CBC Lab Results  Component Value Date   WBC 4.9 07/26/2019   HGB 14.1 07/26/2019   HCT 43.4 07/26/2019   MCV 88 07/26/2019   MCH 28.5 07/26/2019   RDW 12.7 07/26/2019   PLT 221 29/93/7169   Last metabolic panel Lab Results  Component Value Date   GLUCOSE 97 08/04/2022   NA 139 08/04/2022   K 3.9 08/04/2022   CL 102 08/04/2022   CO2 25 08/04/2022   BUN 13 08/04/2022   CREATININE 0.81 08/04/2022   EGFR 78 08/04/2022   CALCIUM 9.5 08/04/2022   PROT 6.4 08/04/2022   ALBUMIN 4.8 08/04/2022   LABGLOB 1.6 08/04/2022   AGRATIO 3.0 (H) 08/04/2022   BILITOT 0.4 08/04/2022   ALKPHOS 96 08/04/2022   AST 17 08/04/2022   ALT 14 08/04/2022   Last lipids Lab Results  Component Value Date   CHOL 191 08/04/2022   HDL 115 08/04/2022   LDLCALC 64 08/04/2022   TRIG 63 08/04/2022   CHOLHDL 1.8 08/06/2021   Last hemoglobin A1c Lab Results  Component Value Date   HGBA1C 5.6 08/04/2022   Last thyroid functions Lab Results  Component Value Date   TSH 3.240 08/04/2022   T4TOTAL 9.6 08/04/2022   Last vitamin B12  and Folate Lab Results  Component Value Date   VITAMINB12 261 07/14/2017        Objective    Vitals: There were no vitals taken for this visit. BP Readings from Last 3 Encounters:  08/09/22 (!) 140/71  05/05/22 (!) 160/88  03/04/22 131/80   Wt Readings from Last 3 Encounters:  08/09/22 150 lb 11.2 oz (68.4 kg)  05/05/22 155 lb 8 oz (70.5 kg)  03/04/22 157 lb 1.6 oz (71.3 kg)       Physical Exam ***  Most recent functional status assessment:    02/04/2022   10:39 AM  In your present state of health, do you have any difficulty  performing the following activities:  Hearing? 0  Vision? 0  Difficulty concentrating or making decisions? 0  Walking or climbing stairs? 0  Dressing or bathing? 0  Doing errands, shopping? 0   Most recent fall risk assessment:    08/09/2022    1:35 PM  Fall Risk   Falls in the past year? 0  Number falls in past yr: 0  Injury with Fall? 0  Risk for fall due to : No Fall Risks  Follow up Falls evaluation completed    Most recent depression screenings:    08/09/2022    1:35 PM 02/04/2022   10:39 AM  PHQ 2/9 Scores  PHQ - 2 Score 0 0  PHQ- 9 Score 0    Most recent cognitive screening:    07/14/2017   10:39 AM  6CIT Screen  What Year? 0 points  What month? 0 points  What time? 0 points  Count back from 20 0 points  Months in reverse 0 points  Repeat phrase 0 points  Total Score 0 points   Most recent Audit-C alcohol use screening    02/04/2022   10:39 AM  Alcohol Use Disorder Test (AUDIT)  1. How often do you have a drink containing alcohol? 1  2. How many drinks containing alcohol do you have on a typical day when you are drinking? 0  3. How often do you have six or more drinks on one occasion? 0  AUDIT-C Score 1   A score of 3 or more in women, and 4 or more in men indicates increased risk for alcohol abuse, EXCEPT if all of the points are from question 1   No results found for any visits on 09/02/22.  Assessment & Plan     Annual wellness visit done today including the all of the following: Reviewed patient's Family Medical History Reviewed and updated list of patient's medical providers Assessment of cognitive impairment was done Assessed patient's functional ability Established a written schedule for health screening [place] Completed and Reviewed  Exercise Activities and Dietary recommendations  Goals      DIET - REDUCE SUGAR INTAKE     Recommend to cut back on sugar in daily diet to help aid in weight loss.     Exercise  150 minutes per week (moderate activity)        Immunization History  Administered Date(s) Administered   Fluad Quad(high Dose 65+) 07/24/2019, 07/27/2020, 08/06/2021   Influenza Split 08/08/2012   Influenza, High Dose Seasonal PF 07/01/2016, 07/14/2017, 07/18/2018   Influenza,inj,Quad PF,6+ Mos 07/27/2013, 08/12/2014, 06/25/2015   PFIZER(Purple Top)SARS-COV-2 Vaccination 10/29/2019, 11/19/2019   Pneumococcal Conjugate-13 11/01/2018   Pneumococcal Polysaccharide-23 07/14/2017   Tdap 01/11/2008    Health Maintenance  Topic Date Due   Zoster Vaccines- Shingrix (1 of  2) Never done   DTaP/Tdap/Td (2 - Td or Tdap) 01/10/2018   COVID-19 Vaccine (3 - Pfizer risk series) 12/17/2019   Fecal DNA (Cologuard)  06/12/2022   Medicare Annual Wellness (AWV)  08/06/2022   INFLUENZA VACCINE  12/18/2022 (Originally 04/19/2022)   DEXA SCAN  10/10/2022   MAMMOGRAM  07/26/2024   Pneumonia Vaccine 69+ Years old  Completed   Hepatitis C Screening  Completed   HPV VACCINES  Aged Out   COLONOSCOPY (Pts 45-19yr Insurance coverage will need to be confirmed)  Discontinued     Discussed health benefits of physical activity, and encouraged her to engage in regular exercise appropriate for her age and condition.    ***  No follow-ups on file.     {provider attestation***:1}   ALavon Paganini MD  [phone](phone) [phone](fax)  [place]

## 2022-09-02 ENCOUNTER — Ambulatory Visit: Payer: Medicare PPO | Admitting: Family Medicine

## 2022-09-02 ENCOUNTER — Ambulatory Visit (INDEPENDENT_AMBULATORY_CARE_PROVIDER_SITE_OTHER): Payer: Medicare PPO | Admitting: Family Medicine

## 2022-09-02 DIAGNOSIS — Z23 Encounter for immunization: Secondary | ICD-10-CM | POA: Diagnosis not present

## 2022-09-02 DIAGNOSIS — I10 Essential (primary) hypertension: Secondary | ICD-10-CM

## 2022-09-02 DIAGNOSIS — R7303 Prediabetes: Secondary | ICD-10-CM

## 2022-09-02 DIAGNOSIS — E782 Mixed hyperlipidemia: Secondary | ICD-10-CM

## 2022-09-02 DIAGNOSIS — E038 Other specified hypothyroidism: Secondary | ICD-10-CM

## 2022-09-02 DIAGNOSIS — Z0000 Encounter for general adult medical examination without abnormal findings: Secondary | ICD-10-CM

## 2022-09-02 NOTE — Progress Notes (Signed)
Flu vaccine provided.  

## 2022-09-16 NOTE — Progress Notes (Signed)
I,[name],acting as a Neurosurgeon for Angela Latch, MD.,have documented all relevant documentation on the behalf of Angela Latch, MD,as directed by  Angela Latch, MD while in the presence of Angela Latch, MD.    Annual Wellness Visit     Patient: Angela Valenzuela, Female    DOB: [birth year], 71 y.o.   MRN: [id number] Visit Date: 09/22/2022  Today's Provider: [name], MD   Chief Complaint  Patient presents with   Medicare Wellness   Subjective    Angela Valenzuela is a 71 y.o. female who presents today for her Annual Wellness Visit. She reports consuming a general diet. Home exercise routine includes stretching. She generally feels well. She reports sleeping well. She does not have additional problems to discuss today.   HPI Patient declined 6CIT   Medications: Outpatient Medications Prior to Visit  Medication Sig   calcium carbonate (TUMS EX) 750 MG chewable tablet Chew 1 tablet by mouth daily.   Calcium-Vitamin D 600-200 MG-UNIT per tablet Take 1 tablet by mouth daily.    diclofenac Sodium (VOLTAREN) 1 % GEL Apply 1 application topically daily. As needed   hydrochlorothiazide (HYDRODIURIL) 12.5 MG tablet Take 1 tablet (12.5 mg total) by mouth daily.   Omega-3 Fatty Acids (OMEGA-3 FISH OIL PO) Take by mouth.   PREVIDENT 5000 DRY MOUTH 1.1 % GEL dental gel USE AS DIRECTED 1 TIME DAILY   simvastatin (ZOCOR) 20 MG tablet TAKE 1 TABLET BY MOUTH EVERYDAY AT BEDTIME   ursodiol (ACTIGALL) 500 MG tablet TAKE 1 TABLET BY MOUTH 3 (THREE) TIMES DAILY (Patient taking differently: Take 500 mg by mouth in the morning and at bedtime. TAKE 1 TABLET BY MOUTH 2 (TWO) TIMES DAILY)   [DISCONTINUED] Lifitegrast (XIIDRA) 5 % SOLN Apply 2 drops to eye daily. (Patient not taking: Reported on 09/22/2022)   No facility-administered medications prior to visit.    No Known Allergies  Patient Care Team: Erasmo Downer, MD as PCP - General (Family Medicine) Kandyce Rud., MD (Rheumatology) Midge Minium, MD as Consulting Physician (Gastroenterology) System, Provider Not In [place], Ronaldo Miyamoto, MD as Referring Physician (Ophthalmology) Melina Fiddler, MD as Consulting Physician (Sports Medicine) Glendale Chard, DO as Consulting Physician (Neurology)  Review of Systems  All other systems reviewed and are negative.   Last CBC Lab Results  Component Value Date   WBC 4.9 07/26/2019   HGB 14.1 07/26/2019   HCT 43.4 07/26/2019   MCV 88 07/26/2019   MCH 28.5 07/26/2019   RDW 12.7 07/26/2019   PLT 221 07/26/2019   Last metabolic panel Lab Results  Component Value Date   GLUCOSE 97 08/04/2022   NA 139 08/04/2022   K 3.9 08/04/2022   CL 102 08/04/2022   CO2 25 08/04/2022   BUN 13 08/04/2022   CREATININE 0.81 08/04/2022   EGFR 78 08/04/2022   CALCIUM 9.5 08/04/2022   PROT 6.4 08/04/2022   ALBUMIN 4.8 08/04/2022   LABGLOB 1.6 08/04/2022   AGRATIO 3.0 (H) 08/04/2022   BILITOT 0.4 08/04/2022   ALKPHOS 96 08/04/2022   AST 17 08/04/2022   ALT 14 08/04/2022   Last lipids Lab Results  Component Value Date   CHOL 191 08/04/2022   HDL 115 08/04/2022   LDLCALC 64 08/04/2022   TRIG 63 08/04/2022   CHOLHDL 1.8 08/06/2021   Last hemoglobin A1c Lab Results  Component Value Date   HGBA1C 5.6 08/04/2022   Last thyroid functions Lab Results  Component  Value Date   TSH 3.240 08/04/2022   T4TOTAL 9.6 08/04/2022   Last vitamin D No results found for: "25OHVITD2", "25OHVITD3", "VD25OH" Last vitamin B12 and Folate Lab Results  Component Value Date   VITAMINB12 261 07/14/2017        Objective    Vitals: BP 125/77 Comment: home reading  Pulse 81   Temp 98.2 F (36.8 C) (Oral)   Resp 16   Ht 5\' 7"  (1.702 m)   Wt 153 lb 3.2 oz (69.5 kg)   BMI 23.99 kg/m  BP Readings from Last 3 Encounters:  09/22/22 125/77  08/09/22 (!) 140/71  05/05/22 (!) 160/88   Wt Readings from Last 3 Encounters:  09/22/22 153 lb 3.2 oz (69.5  kg)  08/09/22 150 lb 11.2 oz (68.4 kg)  05/05/22 155 lb 8 oz (70.5 kg)       Physical Exam Vitals reviewed.  Constitutional:      General: She is not in acute distress.    Appearance: Normal appearance. She is well-developed. She is not diaphoretic.  HENT:     Head: Normocephalic and atraumatic.     Right Ear: External ear normal.     Left Ear: External ear normal.     Nose: Nose normal.  Eyes:     General: No scleral icterus.    Conjunctiva/sclera: Conjunctivae normal.     Pupils: Pupils are equal, round, and reactive to light.  Neck:     Thyroid: No thyromegaly.  Cardiovascular:     Rate and Rhythm: Normal rate and regular rhythm.     Pulses: Normal pulses.     Heart sounds: Normal heart sounds. No murmur heard. Pulmonary:     Effort: Pulmonary effort is normal. No respiratory distress.     Breath sounds: Normal breath sounds. No wheezing or rales.  Abdominal:     General: There is no distension.     Palpations: Abdomen is soft.     Tenderness: There is no abdominal tenderness.  Musculoskeletal:        General: No deformity.     Cervical back: Neck supple.     Right lower leg: No edema.     Left lower leg: No edema.  Lymphadenopathy:     Cervical: No cervical adenopathy.  Skin:    General: Skin is warm and dry.     Findings: No rash.  Neurological:     Mental Status: She is alert and oriented to person, place, and time. Mental status is at baseline.     Gait: Gait normal.  Psychiatric:        Mood and Affect: Mood normal.        Behavior: Behavior normal.        Thought Content: Thought content normal.      Most recent functional status assessment:    09/22/2022   11:30 AM  In your present state of health, do you have any difficulty performing the following activities:  Hearing? 0  Vision? 0  Difficulty concentrating or making decisions? 0  Walking or climbing stairs? 0  Dressing or bathing? 0  Doing errands, shopping? 0   Most recent fall risk  assessment:    09/22/2022   11:30 AM  Fall Risk   Falls in the past year? 0  Number falls in past yr: 0  Injury with Fall? 0  Risk for fall due to : No Fall Risks  Follow up Falls evaluation completed    Most recent depression screenings:  09/22/2022   11:30 AM 08/09/2022    1:35 PM  PHQ 2/9 Scores  PHQ - 2 Score 0 0  PHQ- 9 Score 0 0   Most recent cognitive screening:    07/14/2017   10:39 AM  6CIT Screen  What Year? 0 points  What month? 0 points  What time? 0 points  Count back from 20 0 points  Months in reverse 0 points  Repeat phrase 0 points  Total Score 0 points   Most recent Audit-C alcohol use screening    09/22/2022   11:30 AM  Alcohol Use Disorder Test (AUDIT)  1. How often do you have a drink containing alcohol? 0  2. How many drinks containing alcohol do you have on a typical day when you are drinking? 0  3. How often do you have six or more drinks on one occasion? 0  AUDIT-C Score 0   A score of 3 or more in women, and 4 or more in men indicates increased risk for alcohol abuse, EXCEPT if all of the points are from question 1   No results found for any visits on 09/22/22.  Assessment & Plan     Annual wellness visit done today including the all of the following: Reviewed patient's Family Medical History Reviewed and updated list of patient's medical providers Assessment of cognitive impairment was done Assessed patient's functional ability Established a written schedule for health screening services Health Risk Assessent Completed and Reviewed  Exercise Activities and Dietary recommendations  Goals      DIET - REDUCE SUGAR INTAKE     Recommend to cut back on sugar in daily diet to help aid in weight loss.     Exercise 150 minutes per week (moderate activity)        Immunization History  Administered Date(s) Administered   Fluad Quad(high Dose 65+) 07/24/2019, 07/27/2020, 08/06/2021, 09/02/2022   Influenza Split 08/08/2012   Influenza,  High Dose Seasonal PF 07/01/2016, 07/14/2017, 07/18/2018   Influenza,inj,Quad PF,6+ Mos 07/27/2013, 08/12/2014, 06/25/2015   PFIZER(Purple Top)SARS-COV-2 Vaccination 10/29/2019, 11/19/2019   Pneumococcal Conjugate-13 11/01/2018   Pneumococcal Polysaccharide-23 07/14/2017   Tdap 01/11/2008    Health Maintenance  Topic Date Due   Zoster Vaccines- Shingrix (1 of 2) Never done   DTaP/Tdap/Td (2 - Td or Tdap) 01/10/2018   COVID-19 Vaccine (3 - Pfizer risk series) 12/17/2019   Fecal DNA (Cologuard)  06/12/2022   DEXA SCAN  10/10/2022   Medicare Annual Wellness (AWV)  09/23/2023   MAMMOGRAM  07/26/2024   Pneumonia Vaccine 10+ Years old  Completed   INFLUENZA VACCINE  Completed   Hepatitis C Screening  Completed   HPV VACCINES  Aged Out   COLONOSCOPY (Pts 45-27yrs Insurance coverage will need to be confirmed)  Discontinued     Discussed health benefits of physical activity, and encouraged her to engage in regular exercise appropriate for her age and condition.    Problem List Items Addressed This Visit       Cardiovascular and Mediastinum   Hypertension    Well controlled on home readings Continue current medications Recheck metabolic panel F/u in 6 months         Endocrine   Subclinical hypothyroidism    Well controlled on recent labs Continue to monitor        Musculoskeletal and Integument   Osteopenia of neck of left femur    Repeat DEXA      Relevant Orders   DG Bone Density  Other   Hyperlipidemia    Previously well controlled on recent labs Continue statin Repeat FLP and CMP annually      Prediabetes    Normal A1c on recent labs      Other Visit Diagnoses     Encounter for Medicare annual wellness exam    -  Primary   Encounter for annual physical exam       Colon cancer screening       Relevant Orders   Cologuard        Return in about 6 months (around 03/23/2023) for chronic disease f/u.     I, Angela Latch, MD, have reviewed all  documentation for this visit. The documentation on 09/22/22 for the exam, diagnosis, procedures, and orders are all accurate and complete.   Catelynn Sparger, Marzella Schlein, MD, MPH [facility]

## 2022-09-20 ENCOUNTER — Telehealth: Payer: Self-pay | Admitting: Family Medicine

## 2022-09-20 NOTE — Telephone Encounter (Signed)
Pt is calling to reschedule AWV Please advise CB- [phone]

## 2022-09-22 ENCOUNTER — Ambulatory Visit (INDEPENDENT_AMBULATORY_CARE_PROVIDER_SITE_OTHER): Payer: Medicare PPO | Admitting: Family Medicine

## 2022-09-22 ENCOUNTER — Encounter: Payer: Self-pay | Admitting: Family Medicine

## 2022-09-22 VITALS — BP 125/77 | HR 81 | Temp 98.2°F | Resp 16 | Ht 67.0 in | Wt 153.2 lb

## 2022-09-22 DIAGNOSIS — I10 Essential (primary) hypertension: Secondary | ICD-10-CM

## 2022-09-22 DIAGNOSIS — E038 Other specified hypothyroidism: Secondary | ICD-10-CM | POA: Diagnosis not present

## 2022-09-22 DIAGNOSIS — M85852 Other specified disorders of bone density and structure, left thigh: Secondary | ICD-10-CM | POA: Diagnosis not present

## 2022-09-22 DIAGNOSIS — Z0000 Encounter for general adult medical examination without abnormal findings: Secondary | ICD-10-CM

## 2022-09-22 DIAGNOSIS — Z1211 Encounter for screening for malignant neoplasm of colon: Secondary | ICD-10-CM

## 2022-09-22 DIAGNOSIS — R7303 Prediabetes: Secondary | ICD-10-CM | POA: Diagnosis not present

## 2022-09-22 DIAGNOSIS — E782 Mixed hyperlipidemia: Secondary | ICD-10-CM

## 2022-09-22 NOTE — Assessment & Plan Note (Signed)
Repeat DEXA. 

## 2022-09-22 NOTE — Assessment & Plan Note (Signed)
Previously well controlled on recent labs Continue statin Repeat FLP and CMP annually

## 2022-09-22 NOTE — Assessment & Plan Note (Signed)
Normal A1c on recent labs

## 2022-09-22 NOTE — Assessment & Plan Note (Signed)
Well controlled on home readings Continue current medications Recheck metabolic panel F/u in 6 months  

## 2022-09-22 NOTE — Assessment & Plan Note (Signed)
Well controlled on recent labs. Continue to monitor.  

## 2022-09-22 NOTE — Patient Instructions (Signed)
   The CDC recommends two doses of Shingrix (the shingles vaccine) separated by 2 to 6 months for adults age 72 years and older. I recommend checking with your insurance plan regarding coverage for this vaccine.   

## 2022-10-12 ENCOUNTER — Other Ambulatory Visit: Payer: Medicare PPO

## 2022-10-17 DIAGNOSIS — Z1211 Encounter for screening for malignant neoplasm of colon: Secondary | ICD-10-CM | POA: Diagnosis not present

## 2022-10-27 ENCOUNTER — Other Ambulatory Visit: Payer: Self-pay | Admitting: Family Medicine

## 2022-10-28 ENCOUNTER — Other Ambulatory Visit: Payer: Medicare PPO

## 2022-10-28 LAB — COLOGUARD: COLOGUARD: NEGATIVE

## 2022-11-01 ENCOUNTER — Other Ambulatory Visit: Payer: Self-pay | Admitting: Family Medicine

## 2022-11-07 ENCOUNTER — Ambulatory Visit
Admission: RE | Admit: 2022-11-07 | Discharge: 2022-11-07 | Disposition: A | Discharge status: Home / Self Care | Payer: Medicare PPO | Source: Ambulatory Visit | Attending: Family Medicine | Admitting: Family Medicine

## 2022-11-07 DIAGNOSIS — M810 Age-related osteoporosis without current pathological fracture: Secondary | ICD-10-CM | POA: Diagnosis not present

## 2022-11-07 DIAGNOSIS — M85852 Other specified disorders of bone density and structure, left thigh: Secondary | ICD-10-CM | POA: Diagnosis not present

## 2022-11-18 ENCOUNTER — Telehealth (INDEPENDENT_AMBULATORY_CARE_PROVIDER_SITE_OTHER): Payer: Medicare PPO | Admitting: Family Medicine

## 2022-11-18 DIAGNOSIS — M810 Age-related osteoporosis without current pathological fracture: Secondary | ICD-10-CM | POA: Diagnosis not present

## 2022-11-18 MED ORDER — ALENDRONATE SODIUM 70 MG PO TABS: 70.0000 mg | ORAL_TABLET | ORAL | 3 refills | Status: DC

## 2022-11-18 NOTE — Progress Notes (Signed)
Virtual Visit via Video Note  I connected with Angela Valenzuela on 11/18/22 at 10:20 AM EST by a video enabled telemedicine application and verified that I am speaking with the correct person using two identifiers.  Location: Patient location: home Provider location: home office  Persons involved in the visit: patient, provider    I discussed the limitations of evaluation and management by telemedicine and the availability of in person appointments. The patient expressed understanding and agreed to proceed.  History of Present Illness:  Last DEXA 11/07/22  AP Spine L1-L4 11/07/2022 71.8 Normal 0.4 1.248 g/cm2 2.1% - AP Spine L1-L4 10/10/2017 66.8 Normal 0.2 1.222 g/cm2 - -   DualFemur Total Left 11/07/2022 71.8 Osteoporosis -2.7 0.664 g/cm2 -6.7% Yes DualFemur Total Left 10/10/2017 66.8 Osteopenia -2.3 0.712 g/cm2 - -   DualFemur Total Mean 11/07/2022 71.8 Osteopenia -2.3 0.713 g/cm2 -5.6% Yes DualFemur Total Mean 10/10/2017 66.8 Osteopenia -2.0 0.755 g/cm2 - - ASSESSMENT: The BMD measured at Femur Total Left is 0.664 g/cm2 with a T-score of -2.7. This patient is considered osteoporotic according to [facility]) criteria. The scan quality is good. Compared with prior study, there has been no significant change in the spine. Compared with prior study, there has been a significant decrease in the total hip.   Patient is taking Ca/Vit D supplement daily and is a nonsmoker Has never taken Fosamax or other treatment for osteoporosis   Observations/Objective:  Physical Exam Constitutional:      General: She is not in acute distress.    Appearance: Normal appearance.  HENT:     Head: Normocephalic.  Pulmonary:     Effort: Pulmonary effort is normal. No respiratory distress.  Neurological:     Mental Status: She is alert and oriented to person, place, and time. Mental status is at baseline.      Assessment and Plan: Problem List Items Addressed This Visit        Musculoskeletal and Integument   Osteoporosis - Primary    Worsening of bone density from osteopenia to osteoporosis Patient has increased Ca/Vit D supplement Discussed weightbearing exercise and fall avoidance Patient will try fosamax - discussed risks and benefits Repeat DEXA in 2 years.      Relevant Medications   alendronate (FOSAMAX) 70 MG tablet     Follow Up Instructions:    I discussed the assessment and treatment plan with the patient. The patient was provided an opportunity to ask questions and all were answered. The patient agreed with the plan and demonstrated an understanding of the instructions.   The patient was advised to call back or seek an in-person evaluation if the symptoms worsen or if the condition fails to improve as anticipated.  Andrus Sharp, Dionne Bucy, MD, MPH [facility]

## 2022-11-18 NOTE — Assessment & Plan Note (Addendum)
Worsening of bone density from osteopenia to osteoporosis Patient has increased Ca/Vit D supplement Discussed weightbearing exercise and fall avoidance Patient will try fosamax - discussed risks and benefits Repeat DEXA in 2 years.

## 2022-11-22 DIAGNOSIS — H04123 Dry eye syndrome of bilateral lacrimal glands: Secondary | ICD-10-CM | POA: Diagnosis not present

## 2022-11-22 DIAGNOSIS — R682 Dry mouth, unspecified: Secondary | ICD-10-CM | POA: Diagnosis not present

## 2022-11-22 DIAGNOSIS — M349 Systemic sclerosis, unspecified: Secondary | ICD-10-CM | POA: Diagnosis not present

## 2022-11-22 DIAGNOSIS — K743 Primary biliary cirrhosis: Secondary | ICD-10-CM | POA: Diagnosis not present

## 2022-12-06 DIAGNOSIS — D225 Melanocytic nevi of trunk: Secondary | ICD-10-CM | POA: Diagnosis not present

## 2022-12-06 DIAGNOSIS — D2261 Melanocytic nevi of right upper limb, including shoulder: Secondary | ICD-10-CM | POA: Diagnosis not present

## 2022-12-06 DIAGNOSIS — L570 Actinic keratosis: Secondary | ICD-10-CM | POA: Diagnosis not present

## 2022-12-06 DIAGNOSIS — D2271 Melanocytic nevi of right lower limb, including hip: Secondary | ICD-10-CM | POA: Diagnosis not present

## 2022-12-06 DIAGNOSIS — X32XXXA Exposure to sunlight, initial encounter: Secondary | ICD-10-CM | POA: Diagnosis not present

## 2022-12-06 DIAGNOSIS — Z08 Encounter for follow-up examination after completed treatment for malignant neoplasm: Secondary | ICD-10-CM | POA: Diagnosis not present

## 2022-12-06 DIAGNOSIS — Z85828 Personal history of other malignant neoplasm of skin: Secondary | ICD-10-CM | POA: Diagnosis not present

## 2022-12-06 DIAGNOSIS — D2262 Melanocytic nevi of left upper limb, including shoulder: Secondary | ICD-10-CM | POA: Diagnosis not present

## 2023-01-11 ENCOUNTER — Other Ambulatory Visit: Payer: Self-pay | Admitting: Gastroenterology

## 2023-01-13 ENCOUNTER — Telehealth: Payer: Self-pay | Admitting: Gastroenterology

## 2023-01-13 MED ORDER — URSODIOL 500 MG PO TABS: 500.0000 mg | ORAL_TABLET | Freq: Two times a day (BID) | ORAL | 1 refills | Status: DC

## 2023-01-13 NOTE — Telephone Encounter (Signed)
Rx sent through e-scribe  

## 2023-01-13 NOTE — Telephone Encounter (Signed)
Pt is requesting refill for [facility] [place]

## 2023-01-13 NOTE — Addendum Note (Signed)
Addended by: Roena Malady on: 01/13/2023 10:28 AM   Modules accepted: Orders

## 2023-02-15 DIAGNOSIS — H43812 Vitreous degeneration, left eye: Secondary | ICD-10-CM | POA: Diagnosis not present

## 2023-02-15 DIAGNOSIS — Z961 Presence of intraocular lens: Secondary | ICD-10-CM | POA: Diagnosis not present

## 2023-02-15 DIAGNOSIS — H04123 Dry eye syndrome of bilateral lacrimal glands: Secondary | ICD-10-CM | POA: Diagnosis not present

## 2023-03-28 ENCOUNTER — Ambulatory Visit: Payer: Medicare PPO | Admitting: Family Medicine

## 2023-04-19 ENCOUNTER — Encounter: Payer: Self-pay | Admitting: Family Medicine

## 2023-04-21 ENCOUNTER — Other Ambulatory Visit: Payer: Self-pay | Admitting: Family Medicine

## 2023-04-21 NOTE — Telephone Encounter (Signed)
Requested Prescriptions  Pending Prescriptions Disp Refills   simvastatin (ZOCOR) 20 MG tablet [Pharmacy Med Name: SIMVASTATIN 20 MG TABLET] 90 tablet 0    Sig: TAKE 1 TABLET BY MOUTH EVERYDAY AT BEDTIME     Cardiovascular:  Antilipid - Statins Failed - 04/21/2023  2:42 AM      Failed - Lipid Panel in normal range within the last 12 months    Cholesterol, Total  Date Value Ref Range Status  08/04/2022 191 100 - 199 mg/dL Final   LDL Chol Calc (NIH)  Date Value Ref Range Status  08/04/2022 64 0 - 99 mg/dL Final   HDL  Date Value Ref Range Status  08/04/2022 115 >39 mg/dL Final   Triglycerides  Date Value Ref Range Status  08/04/2022 63 0 - 149 mg/dL Final         Passed - Patient is not pregnant      Passed - Valid encounter within last 12 months    Recent Outpatient Visits           5 months ago Age-related osteoporosis without current pathological fracture   [facility] [place], Marzella Schlein, MD   7 months ago Encounter for [employer] annual wellness exam   [facility] [place], Marzella Schlein, MD   8 months ago Upper respiratory tract infection, unspecified type   [facility] [name], PA-C   11 months ago Lump in the groin   [facility] [place], [place], PA-C   1 year ago Primary hypertension   [facility] [place], Marzella Schlein, MD       Future Appointments             In 3 days Bacigalupo, Marzella Schlein, MD [facility]   In 1 week Midge Minium, MD [facility]

## 2023-04-24 ENCOUNTER — Ambulatory Visit: Payer: Medicare PPO | Admitting: Family Medicine

## 2023-04-24 ENCOUNTER — Encounter: Payer: Self-pay | Admitting: Family Medicine

## 2023-04-24 VITALS — BP 128/66 | HR 84 | Temp 98.3°F | Resp 12 | Ht 67.0 in | Wt 153.4 lb

## 2023-04-24 DIAGNOSIS — M349 Systemic sclerosis, unspecified: Secondary | ICD-10-CM | POA: Diagnosis not present

## 2023-04-24 DIAGNOSIS — E782 Mixed hyperlipidemia: Secondary | ICD-10-CM | POA: Diagnosis not present

## 2023-04-24 DIAGNOSIS — E038 Other specified hypothyroidism: Secondary | ICD-10-CM | POA: Diagnosis not present

## 2023-04-24 DIAGNOSIS — M810 Age-related osteoporosis without current pathological fracture: Secondary | ICD-10-CM

## 2023-04-24 DIAGNOSIS — R7303 Prediabetes: Secondary | ICD-10-CM

## 2023-04-24 DIAGNOSIS — I10 Essential (primary) hypertension: Secondary | ICD-10-CM

## 2023-04-24 DIAGNOSIS — K743 Primary biliary cirrhosis: Secondary | ICD-10-CM | POA: Diagnosis not present

## 2023-04-24 NOTE — Assessment & Plan Note (Signed)
Well controlled Continue to monitor LFTs Continue ursodiol 500mg 

## 2023-04-24 NOTE — Assessment & Plan Note (Signed)
Followed by Rheum

## 2023-04-24 NOTE — Assessment & Plan Note (Signed)
Previously well controlled Continue statin Repeat FLP and CMP  

## 2023-04-24 NOTE — Assessment & Plan Note (Signed)
Normal A1c on recent labs Recheck A1c

## 2023-04-24 NOTE — Progress Notes (Signed)
Established Patient Office Visit  Subjective   Patient ID: Angela Valenzuela, female    DOB: [birth year]  Age: 72 y.o. MRN: [id number]  Chief Complaint  Patient presents with   Medical Management of Chronic Issues    She reports good compliance and tolerance with medications. She is not checking her bp at home. She denies any chest pain, shortness of breath or swelling around feet or ankles.     HPI  Discussed the use of AI scribe software for clinical note transcription with the patient, who gave verbal consent to proceed.  History of Present Illness   The patient, with a history of scleroderma and Raynaud's disease, presents for a routine six-month follow-up. The patient's primary concerns are related to her current medication regimen. She is currently taking calcium supplements, simvastatin, HCTZ, and Fosamax. The patient has questions about the timing and absorption of calcium supplements, the necessity of simvastatin, and the difficulty swallowing Fosamax due to its grainy texture. The patient also mentions discontinuing Omega 3 fish oil supplements due to recent studies suggesting potential harm.  The patient also expresses concerns about potential side effects of the shingles vaccine, specifically Guillain-Barre syndrome, due to knowing someone who developed the condition after vaccination. The patient is also dealing with cosmetic concerns related to eye bags following cataract surgery.  The patient reports no swelling in her feet since discontinuing amlodipine and manages her Raynaud's symptoms by avoiding cold temperatures.        ROS per HPI    Objective:     BP 128/66 (BP Location: Left Arm, Patient Position: Sitting, Cuff Size: Large)   Pulse 84   Temp 98.3 F (36.8 C) (Temporal)   Resp 12   Ht 5\' 7"  (1.702 m)   Wt 153 lb 6.4 oz (69.6 kg)   SpO2 100%   BMI 24.03 kg/m    Physical Exam Vitals reviewed.  Constitutional:      General: She is not in acute  distress.    Appearance: Normal appearance. She is well-developed. She is not diaphoretic.  HENT:     Head: Normocephalic and atraumatic.  Eyes:     General: No scleral icterus.    Conjunctiva/sclera: Conjunctivae normal.  Neck:     Thyroid: No thyromegaly.  Cardiovascular:     Rate and Rhythm: Normal rate and regular rhythm.     Heart sounds: Normal heart sounds. No murmur heard. Pulmonary:     Effort: Pulmonary effort is normal. No respiratory distress.     Breath sounds: Normal breath sounds. No wheezing, rhonchi or rales.  Musculoskeletal:     Cervical back: Neck supple.     Right lower leg: No edema.     Left lower leg: No edema.  Lymphadenopathy:     Cervical: No cervical adenopathy.  Skin:    General: Skin is warm and dry.     Findings: No rash.  Neurological:     Mental Status: She is alert and oriented to person, place, and time. Mental status is at baseline.  Psychiatric:        Mood and Affect: Mood normal.        Behavior: Behavior normal.      No results found for any visits on 04/24/23.    The ASCVD Risk score (Arnett DK, et al., 2019) failed to calculate for the following reasons:   The valid HDL cholesterol range is 20 to 100 mg/dL    Assessment & Plan:   Problem List  Items Addressed This Visit       Cardiovascular and Mediastinum   Hypertension   Relevant Orders   Comprehensive metabolic panel     Digestive   Primary biliary cholangitis (HCC)    Well controlled Continue to monitor LFTs Continue ursodiol 500mg          Endocrine   Subclinical hypothyroidism - Primary   Relevant Orders   TSH + free T4     Musculoskeletal and Integument   Buschke's scleredema (HCC)    Followed by Rheum      Osteoporosis     Other   Hyperlipidemia    Previously well controlled Continue statin Repeat FLP and CMP      Relevant Orders   Lipid Panel With LDL/HDL Ratio   Prediabetes    Normal A1c on recent labs Recheck A1c      Relevant  Orders   Hemoglobin A1c        Osteoporosis Taking Calcium 600mg  twice daily and Vitamin D. Discussed optimal timing of intake around meals for better absorption. -Continue current regimen.  Hyperlipidemia On Simvastatin 20mg  daily. No reported muscle aches or side effects. Cholesterol well controlled. -Continue Simvastatin 20mg  daily.  Supplement use Discontinued Omega-3 Fish Oil based on recent studies and doctor's advice. No need to resume. -Discontinue Omega-3 Fish Oil.  Osteoporosis treatment On Fosamax. Reported difficulty swallowing the pill due to its texture and her dry mouth. No other side effects reported. -Continue Fosamax as prescribed. Take with a full glass of water and remain upright for at least 30-60 minutes after intake.  Raynaud's Phenomenon No current medication. Reports managing symptoms by avoiding cold exposure. -Continue current management strategy.  General Health Maintenance -Order labs today for thyroid, A1c, liver and kidney function, and cholesterol. -Discussed and reassured patient about the very low risk of Guillain-Barre syndrome with the Shingles vaccine. Patient to consider the vaccine. -Encouraged patient to consider using a Vitamin C serum for skin care.        Return in about 6 months (around 10/25/2023) for CPE, AWV.    [name], MD

## 2023-05-02 ENCOUNTER — Ambulatory Visit: Payer: Medicare PPO | Admitting: Gastroenterology

## 2023-05-02 ENCOUNTER — Encounter: Payer: Self-pay | Admitting: Gastroenterology

## 2023-05-02 VITALS — BP 148/83 | HR 71 | Temp 98.0°F | Wt 152.0 lb

## 2023-05-02 DIAGNOSIS — K743 Primary biliary cirrhosis: Secondary | ICD-10-CM | POA: Diagnosis not present

## 2023-05-02 MED ORDER — URSODIOL 500 MG PO TABS: 500.0000 mg | ORAL_TABLET | Freq: Two times a day (BID) | ORAL | 3 refills | Status: DC

## 2023-05-02 NOTE — Progress Notes (Signed)
Primary Care Physician: Erasmo Downer, MD  Primary Gastroenterologist:  Dr. Midge Minium  Chief Complaint  Patient presents with   Primary biliary cholangitis    Had a CMP lab on 04/24/23   Follow-up    HPI: Angela Valenzuela is a 72 y.o. female here for medication refill and follow-up for her PBC.  The patient has been doing well and her liver enzymes have been normal for some time.  The patient states that she has no problems at the present time.  She does not have any itching or report any jaundice.   Past Medical History:  Diagnosis Date   Allergy    Cataract    Hyperlipidemia    Hypertension    Increased liver enzymes 03/17/2015   Paroxysmal digital cyanosis 03/17/2015   Overview:   a.  Positive anticentromere antibody b.   Sicca-xerostomia c.  RVSP 41 in 2013    Raynaud's disease 04/30/2015   Thyroid disease     Current Outpatient Medications  Medication Sig Dispense Refill   alendronate (FOSAMAX) 70 MG tablet Take 1 tablet (70 mg total) by mouth every 7 (seven) days. Take with a full glass of water on an empty stomach. 12 tablet 3   calcium carbonate (TUMS EX) 750 MG chewable tablet Chew 1 tablet by mouth daily.     Calcium-Vitamin D 600-200 MG-UNIT per tablet Take 1 tablet by mouth daily.      diclofenac Sodium (VOLTAREN) 1 % GEL Apply 1 application topically daily. As needed     hydrochlorothiazide (HYDRODIURIL) 12.5 MG tablet TAKE 1 TABLET BY MOUTH EVERY DAY 90 tablet 3   PREVIDENT 5000 DRY MOUTH 1.1 % GEL dental gel USE AS DIRECTED 1 TIME DAILY  3   simvastatin (ZOCOR) 20 MG tablet TAKE 1 TABLET BY MOUTH EVERYDAY AT BEDTIME 90 tablet 0   ursodiol (ACTIGALL) 500 MG tablet Take 1 tablet (500 mg total) by mouth in the morning and at bedtime. MUST SCHEDULE OFFICE VISIT 180 tablet 1   No current facility-administered medications for this visit.    Allergies as of 05/02/2023   (No Known Allergies)    ROS:  General: Negative for anorexia, weight loss, fever,  chills, fatigue, weakness. ENT: Negative for hoarseness, difficulty swallowing , nasal congestion. CV: Negative for chest pain, angina, palpitations, dyspnea on exertion, peripheral edema.  Respiratory: Negative for dyspnea at rest, dyspnea on exertion, cough, sputum, wheezing.  GI: See history of present illness. GU:  Negative for dysuria, hematuria, urinary incontinence, urinary frequency, nocturnal urination.  Endo: Negative for unusual weight change.    Physical Examination:   BP (!) 151/78 (BP Location: Left Arm, Patient Position: Sitting, Cuff Size: Normal)   Pulse 70   Temp 98 F (36.7 C) (Oral)   Wt 152 lb (68.9 kg)   BMI 23.81 kg/m   General: Well-nourished, well-developed in no acute distress.  Eyes: No icterus. Conjunctivae pink. Neuro: Alert and oriented x 3.  Grossly intact. Psych: Alert and cooperative, normal mood and affect.  Labs:    Imaging Studies: No results found.  Assessment and Plan:   Angela Valenzuela is a 72 y.o. y/o female who comes in today with a history of PBC.  The patient was diagnosed in 2016 by me with abnormal alkaline phosphatase and a positive AMA.  The patient has been treated with 500 mg of Urso twice a day with normalization of her alkaline phosphatase.  The patient will have her medication refilled and  follow-up with me in a years time.  The patient has been explained the plan and agrees with it.     Midge Minium, MD. Clementeen Graham    Note: This dictation was prepared with Dragon dictation along with smaller phrase technology. Any transcriptional errors that result from this process are unintentional.

## 2023-07-27 ENCOUNTER — Other Ambulatory Visit: Payer: Self-pay | Admitting: Family Medicine

## 2023-07-27 NOTE — Telephone Encounter (Signed)
Requested Prescriptions  Pending Prescriptions Disp Refills   simvastatin (ZOCOR) 20 MG tablet [Pharmacy Med Name: SIMVASTATIN 20 MG TABLET] 90 tablet 0    Sig: TAKE 1 TABLET BY MOUTH EVERYDAY AT BEDTIME     Cardiovascular:  Antilipid - Statins Failed - 07/27/2023  1:33 AM      Failed - Lipid Panel in normal range within the last 12 months    Cholesterol, Total  Date Value Ref Range Status  04/24/2023 201 (H) 100 - 199 mg/dL Final   LDL Chol Calc (NIH)  Date Value Ref Range Status  04/24/2023 82 0 - 99 mg/dL Final   HDL  Date Value Ref Range Status  04/24/2023 109 >39 mg/dL Final   Triglycerides  Date Value Ref Range Status  04/24/2023 53 0 - 149 mg/dL Final         Passed - Patient is not pregnant      Passed - Valid encounter within last 12 months    Recent Outpatient Visits           3 months ago Subclinical hypothyroidism   [place] [facility] [place], Marzella Schlein, MD   8 months ago Age-related osteoporosis without current pathological fracture    [facility] [place], Marzella Schlein, MD   10 months ago Encounter for [employer] annual wellness exam   [facility] [place], Marzella Schlein, MD   11 months ago Upper respiratory tract infection, unspecified type   [facility] [name], PA-C   1 year ago Lump in the groin   [facility] [place], [place], PA-C       Future Appointments             In 3 months Bacigalupo, Marzella Schlein, MD [facility]

## 2023-10-08 ENCOUNTER — Other Ambulatory Visit: Payer: Self-pay | Admitting: Family Medicine

## 2023-10-18 ENCOUNTER — Ambulatory Visit: Payer: Medicare PPO | Admitting: Emergency Medicine

## 2023-10-18 VITALS — Ht 66.0 in | Wt 150.0 lb

## 2023-10-18 DIAGNOSIS — Z1231 Encounter for screening mammogram for malignant neoplasm of breast: Secondary | ICD-10-CM

## 2023-10-18 DIAGNOSIS — H9193 Unspecified hearing loss, bilateral: Secondary | ICD-10-CM

## 2023-10-18 DIAGNOSIS — Z0000 Encounter for general adult medical examination without abnormal findings: Secondary | ICD-10-CM | POA: Diagnosis not present

## 2023-10-18 NOTE — Patient Instructions (Addendum)
Ms. Azzaro , Thank you for taking time to come for your Medicare Wellness Visit. I appreciate your ongoing commitment to your health goals. Please review the following plan we discussed and let me know if I can assist you in the future.   Referrals/Orders/Follow-Ups/Clinician Recommendations:   I have placed a referral to ENT to evaluate hearing loss. Someone from their office should call you to schedule appointment. I have placed an order for a MMG. Call [facility] @ [phone] to schedule at your convenience. Talk with Dr. B at your next appointment to see if you would still benefit from a flu vaccine.  This is a list of the screening recommended for you and due dates:  Health Maintenance  Topic Date Due   Zoster (Shingles) Vaccine (1 of 2) Never done   DTaP/Tdap/Td vaccine (2 - Td or Tdap) 01/10/2018   COVID-19 Vaccine (3 - Pfizer risk series) 12/17/2019   Mammogram  07/27/2023   Flu Shot  12/18/2023*   Medicare Annual Wellness Visit  10/17/2024   DEXA scan (bone density measurement)  11/07/2024   Cologuard (Stool DNA test)  10/17/2025   Pneumonia Vaccine  Completed   Hepatitis C Screening  Completed   HPV Vaccine  Aged Out   Colon Cancer Screening  Discontinued  *Topic was postponed. The date shown is not the original due date.    Advanced directives: (Copy Requested) Please bring a copy of your health care power of attorney and living will to the office to be added to your chart at your convenience.  Next Medicare Annual Wellness Visit scheduled for next year: Yes, 10/23/24 @ 11:30am (phone visit)

## 2023-10-18 NOTE — Progress Notes (Signed)
Subjective:   Angela Valenzuela is a 73 y.o. female who presents for Medicare Annual (Subsequent) preventive examination.  This patient declined Interactive audio and Acupuncturist. Therefore the visit was completed with audio only.   Visit Complete: Virtual I connected with  Laurine Blazer on 10/18/23 by a audio enabled telemedicine application and verified that I am speaking with the correct person using two identifiers.  Patient Location: Home  Provider Location: Home Office  I discussed the limitations of evaluation and management by telemedicine. The patient expressed understanding and agreed to proceed.  Vital Signs: Because this visit was a virtual/telehealth visit, some criteria may be missing or patient reported. Any vitals not documented were not able to be obtained and vitals that have been documented are patient reported.   Cardiac Risk Factors include: advanced age (>64men, >65 women);dyslipidemia;hypertension;Other (see comment), Risk factor comments: prediabetic     Objective:    Today's Vitals   10/18/23 1139  Weight: 150 lb (68 kg)  Height: 5\' 6"  (1.676 m)   Body mass index is 24.21 kg/m.     10/18/2023   11:57 AM 07/27/2020   10:42 AM 07/24/2019    9:45 AM 06/25/2015   10:16 AM  Advanced Directives  Does Patient Have a Medical Advance Directive? Yes Yes Yes Yes  Type of Estate agent of [place];Living will Healthcare Power of [place];Living will Healthcare Power of [place];Living will Healthcare Power of [place];Living will  Does patient want to make changes to medical advance directive? No - Patient declined     Copy of Healthcare Power of Attorney in Chart? No - copy requested No - copy requested No - copy requested No - copy requested    Current Medications (verified) Outpatient Encounter Medications as of 10/18/2023  Medication Sig   alendronate (FOSAMAX) 70 MG tablet TAKE 1 TABLET (70 MG TOTAL) BY MOUTH EVERY 7 DAYS  WITH FULL GLASS WATER ON EMPTY STOMACH   calcium carbonate (TUMS EX) 750 MG chewable tablet Chew 1 tablet by mouth daily.   Calcium-Vitamin D 600-200 MG-UNIT per tablet Take 1 tablet by mouth daily.    diclofenac Sodium (VOLTAREN) 1 % GEL Apply 1 application topically daily. As needed   hydrochlorothiazide (HYDRODIURIL) 12.5 MG tablet TAKE 1 TABLET BY MOUTH EVERY DAY   simvastatin (ZOCOR) 20 MG tablet TAKE 1 TABLET BY MOUTH EVERYDAY AT BEDTIME   ursodiol (ACTIGALL) 500 MG tablet Take 1 tablet (500 mg total) by mouth in the morning and at bedtime.   PREVIDENT 5000 DRY MOUTH 1.1 % GEL dental gel USE AS DIRECTED 1 TIME DAILY (Patient not taking: Reported on 10/18/2023)   No facility-administered encounter medications on file as of 10/18/2023.    Allergies (verified) Patient has no known allergies.   History: Past Medical History:  Diagnosis Date   Allergy    Cataract    Hyperlipidemia    Hypertension    Increased liver enzymes 03/17/2015   Paroxysmal digital cyanosis 03/17/2015   Overview:   a.  Positive anticentromere antibody b.   Sicca-xerostomia c.  RVSP 41 in 2013    Raynaud's disease 04/30/2015   Thyroid disease    Past Surgical History:  Procedure Laterality Date   EYE SURGERY     EYE SURGERY Bilateral 12/2020   cataract surgery   Phototherapeutic Keratectomy     Family History  Problem Relation Age of Onset   Hypertension Father    Stroke Father    Alzheimer's disease Mother  Social History   Socioeconomic History   Marital status: Married    Spouse name: Copy   Number of children: 1   Years of education: Not on file   Highest education level: Some college, no degree  Occupational History   Occupation: retired  Tobacco Use   Smoking status: Never   Smokeless tobacco: Never  Vaping Use   Vaping status: Never Used  Substance and Sexual Activity   Alcohol use: Not Currently    Alcohol/week: 0.0 standard drinks of alcohol   Drug use: No   Sexual activity:  Not on file  Other Topics Concern   Not on file  Social History Narrative   Not on file   Social Drivers of Health   Financial Resource Strain: Low Risk  (10/18/2023)   Overall Financial Resource Strain (CARDIA)    Difficulty of Paying Living Expenses: Not hard at all  Food Insecurity: No Food Insecurity (10/18/2023)   Hunger Vital Sign    Worried About Running Out of Food in the Last Year: Never true    Ran Out of Food in the Last Year: Never true  Transportation Needs: No Transportation Needs (10/18/2023)   PRAPARE - Administrator, Civil Service (Medical): No    Lack of Transportation (Non-Medical): No  Physical Activity: Insufficiently Active (10/18/2023)   Exercise Vital Sign    Days of Exercise per Week: 3 days    Minutes of Exercise per Session: 20 min  Stress: No Stress Concern Present (10/18/2023)   [employer] of Occupational Health - Occupational Stress Questionnaire    Feeling of Stress : Only a little  Social Connections: Socially Integrated (10/18/2023)   Social Connection and Isolation Panel [NHANES]    Frequency of Communication with Friends and Family: More than three times a week    Frequency of Social Gatherings with Friends and Family: More than three times a week    Attends Religious Services: More than 4 times per year    Active Member of [employer] or Organizations: Yes    Attends Engineer, structural: More than 4 times per year    Marital Status: Married    Tobacco Counseling Counseling given: Not Answered   Clinical Intake:  Pre-visit preparation completed: Yes  Pain : No/denies pain     BMI - recorded: 24.21 Nutritional Status: BMI of 19-24  Normal Nutritional Risks: None Diabetes: No  How often do you need to have someone help you when you read instructions, pamphlets, or other written materials from your doctor or pharmacy?: 1 - Never  Interpreter Needed?: No  Information entered by :: Tora Kindred,  CMA   Activities of Daily Living    10/18/2023   11:40 AM  In your present state of health, do you have any difficulty performing the following activities:  Hearing? 1  Comment placed referral to ENT for hearing evaluation  Vision? 0  Difficulty concentrating or making decisions? 0  Walking or climbing stairs? 0  Dressing or bathing? 0  Doing errands, shopping? 0  Preparing Food and eating ? N  Using the Toilet? N  In the past six months, have you accidently leaked urine? N  Do you have problems with loss of bowel control? N  Managing your Medications? N  Managing your Finances? N  Housekeeping or managing your Housekeeping? N    Patient Care Team: Erasmo Downer, MD as PCP - General (Family Medicine) Kandyce Rud., MD (Rheumatology) Midge Minium, MD as  Consulting Physician (Gastroenterology) System, Provider Not In [place], Ronaldo Miyamoto, MD as Referring Physician (Ophthalmology) Melina Fiddler, MD as Consulting Physician (Sports Medicine) Glendale Chard, DO as Consulting Physician (Neurology)  Indicate any recent Medical Services you may have received from other than [facility] providers in the past year (date may be approximate).     Assessment:   This is a routine wellness examination for [place].  Hearing/Vision screen Hearing Screening - Comments:: Hearing loss, referral placed to ENT Vision Screening - Comments:: Gets eye exams, Dr. Randal Buba, [place]   Goals Addressed               This Visit's Progress     Patient Stated (pt-stated)        Exercise more and decrease sugar and carbs in her diet for prediabetes      Depression Screen    10/18/2023   11:55 AM 09/22/2022   11:30 AM 08/09/2022    1:35 PM 02/04/2022   10:39 AM 08/06/2021    9:43 AM 01/28/2021   10:45 AM 09/24/2020   11:35 AM  PHQ 2/9 Scores  PHQ - 2 Score 0 0 0 0 0 0 0  PHQ- 9 Score  0 0  0 0 0    Fall Risk    10/18/2023   11:49 AM 09/22/2022   11:30 AM 08/09/2022     1:35 PM 02/04/2022   10:39 AM 01/28/2021   10:46 AM  Fall Risk   Falls in the past year? 0 0 0 0 0  Number falls in past yr: 0 0 0 0 0  Injury with Fall? 0 0 0 0 0  Risk for fall due to : No Fall Risks No Fall Risks No Fall Risks  No Fall Risks  Follow up Falls prevention discussed Falls evaluation completed Falls evaluation completed  Falls evaluation completed    MEDICARE RISK AT HOME: Medicare Risk at Home Any stairs in or around the home?: Yes If so, are there any without handrails?: No Home free of loose throw rugs in walkways, pet beds, electrical cords, etc?: Yes Adequate lighting in your home to reduce risk of falls?: Yes Life alert?: No Use of a cane, walker or w/c?: No Grab bars in the bathroom?: No Shower chair or bench in shower?: Yes Elevated toilet seat or a handicapped toilet?: Yes  TIMED UP AND GO:  Was the test performed?  No    Cognitive Function:        10/18/2023   11:57 AM 07/14/2017   10:39 AM  6CIT Screen  What Year? 0 points 0 points  What month? 0 points 0 points  What time? 0 points 0 points  Count back from 20 0 points 0 points  Months in reverse 0 points 0 points  Repeat phrase 0 points 0 points  Total Score 0 points 0 points    Immunizations Immunization History  Administered Date(s) Administered   Fluad Quad(high Dose 65+) 07/24/2019, 07/27/2020, 08/06/2021, 09/02/2022   Influenza Split 08/08/2012   Influenza, High Dose Seasonal PF 07/01/2016, 07/14/2017, 07/18/2018   Influenza,inj,Quad PF,6+ Mos 07/27/2013, 08/12/2014, 06/25/2015   PFIZER(Purple Top)SARS-COV-2 Vaccination 10/29/2019, 11/19/2019   Pneumococcal Conjugate-13 11/01/2018   Pneumococcal Polysaccharide-23 07/14/2017   Tdap 01/11/2008    TDAP status: Due, Education has been provided regarding the importance of this vaccine. Advised may receive this vaccine at local pharmacy or Health Dept. Aware to provide a copy of the vaccination record if obtained from local pharmacy  or  Health Dept. Verbalized acceptance and understanding.  Flu Vaccine status: Due, Education has been provided regarding the importance of this vaccine. Advised may receive this vaccine at local pharmacy or Health Dept. Aware to provide a copy of the vaccination record if obtained from local pharmacy or Health Dept. Verbalized acceptance and understanding.  Pneumococcal vaccine status: Up to date  Covid-19 vaccine status: Declined, Education has been provided regarding the importance of this vaccine but patient still declined. Advised may receive this vaccine at local pharmacy or Health Dept.or vaccine clinic. Aware to provide a copy of the vaccination record if obtained from local pharmacy or Health Dept. Verbalized acceptance and understanding.  Qualifies for Shingles Vaccine? Yes   Zostavax completed No   Shingrix Completed?: Yes  Screening Tests Health Maintenance  Topic Date Due   Zoster Vaccines- Shingrix (1 of 2) Never done   DTaP/Tdap/Td (2 - Td or Tdap) 01/10/2018   COVID-19 Vaccine (3 - Pfizer risk series) 12/17/2019   INFLUENZA VACCINE  12/18/2023 (Originally 04/20/2023)   MAMMOGRAM  07/26/2024   Medicare Annual Wellness (AWV)  10/17/2024   DEXA SCAN  11/07/2024   Fecal DNA (Cologuard)  10/17/2025   Pneumonia Vaccine 54+ Years old  Completed   Hepatitis C Screening  Completed   HPV VACCINES  Aged Out   Colonoscopy  Discontinued    Health Maintenance  Health Maintenance Due  Topic Date Due   Zoster Vaccines- Shingrix (1 of 2) Never done   DTaP/Tdap/Td (2 - Td or Tdap) 01/10/2018   COVID-19 Vaccine (3 - Pfizer risk series) 12/17/2019    Colorectal cancer screening: Type of screening: Cologuard. Completed 10/17/22. Repeat every 3 years  Mammogram status: Ordered 10/18/23. Pt provided with contact info and advised to call to schedule appt.   Bone Density status: Completed 11/07/22. Results reflect: Bone density results: OSTEOPOROSIS. Repeat every 2 years.  Lung Cancer  Screening: (Low Dose CT Chest recommended if Age 44-80 years, 20 pack-year currently smoking OR have quit w/in 15years.) does not qualify.   Lung Cancer Screening Referral: n/a  Additional Screening:  Hepatitis C Screening: does not qualify; Completed 03/17/15  Vision Screening: Recommended annual ophthalmology exams for early detection of glaucoma and other disorders of the eye.  Dental Screening: Recommended annual dental exams for proper oral hygiene   Community Resource Referral / Chronic Care Management: CRR required this visit?  No   CCM required this visit?  No     Plan:     I have personally reviewed and noted the following in the patient's chart:   Medical and social history Use of alcohol, tobacco or illicit drugs  Current medications and supplements including opioid prescriptions. Patient is not currently taking opioid prescriptions. Functional ability and status Nutritional status Physical activity Advanced directives List of other physicians Hospitalizations, surgeries, and ER visits in previous 12 months Vitals Screenings to include cognitive, depression, and falls Referrals and appointments  In addition, I have reviewed and discussed with patient certain preventive protocols, quality metrics, and best practice recommendations. A written personalized care plan for preventive services as well as general preventive health recommendations were provided to patient.     Tora Kindred, CMA   10/18/2023   After Visit Summary: (MyChart) Due to this being a telephonic visit, the after visit summary with patients personalized plan was offered to patient via MyChart   Nurse Notes:  Placed referral to ENT to evaluate hearing loss Placed order for [facility] Needs Tdap, shingles and  flu vaccine

## 2023-10-24 ENCOUNTER — Other Ambulatory Visit: Payer: Self-pay

## 2023-10-24 ENCOUNTER — Encounter: Payer: Self-pay | Admitting: Family Medicine

## 2023-10-24 DIAGNOSIS — R7303 Prediabetes: Secondary | ICD-10-CM

## 2023-10-24 DIAGNOSIS — E782 Mixed hyperlipidemia: Secondary | ICD-10-CM

## 2023-10-24 DIAGNOSIS — I10 Essential (primary) hypertension: Secondary | ICD-10-CM

## 2023-10-26 ENCOUNTER — Other Ambulatory Visit: Payer: Self-pay | Admitting: Family Medicine

## 2023-10-27 DIAGNOSIS — I10 Essential (primary) hypertension: Secondary | ICD-10-CM | POA: Diagnosis not present

## 2023-10-27 DIAGNOSIS — R7303 Prediabetes: Secondary | ICD-10-CM | POA: Diagnosis not present

## 2023-10-27 DIAGNOSIS — E782 Mixed hyperlipidemia: Secondary | ICD-10-CM | POA: Diagnosis not present

## 2023-10-28 LAB — COMPREHENSIVE METABOLIC PANEL
ALT: 11 [IU]/L (ref 0–32)
AST: 17 [IU]/L (ref 0–40)
Albumin: 4.4 g/dL (ref 3.8–4.8)
Alkaline Phosphatase: 68 [IU]/L (ref 44–121)
BUN/Creatinine Ratio: 20 (ref 12–28)
BUN: 15 mg/dL (ref 8–27)
Bilirubin Total: 0.3 mg/dL (ref 0.0–1.2)
CO2: 24 mmol/L (ref 20–29)
Calcium: 9.3 mg/dL (ref 8.7–10.3)
Chloride: 103 mmol/L (ref 96–106)
Creatinine, Ser: 0.76 mg/dL (ref 0.57–1.00)
Globulin, Total: 1.6 g/dL (ref 1.5–4.5)
Glucose: 97 mg/dL (ref 70–99)
Potassium: 4.2 mmol/L (ref 3.5–5.2)
Sodium: 143 mmol/L (ref 134–144)
Total Protein: 6 g/dL (ref 6.0–8.5)
eGFR: 83 mL/min/{1.73_m2} (ref >59)

## 2023-10-28 LAB — LIPID PANEL WITH LDL/HDL RATIO
Cholesterol, Total: 181 mg/dL (ref 100–199)
HDL: 97 mg/dL (ref >39)
LDL Chol Calc (NIH): 74 mg/dL (ref 0–99)
LDL/HDL Ratio: 0.8 {ratio} (ref 0.0–3.2)
Triglycerides: 48 mg/dL (ref 0–149)
VLDL Cholesterol Cal: 10 mg/dL (ref 5–40)

## 2023-10-28 LAB — HEMOGLOBIN A1C
Est. average glucose Bld gHb Est-mCnc: 120 mg/dL
Hgb A1c MFr Bld: 5.8 % — ABNORMAL HIGH (ref 4.8–5.6)

## 2023-10-30 ENCOUNTER — Encounter: Payer: Self-pay | Admitting: Family Medicine

## 2023-11-02 ENCOUNTER — Ambulatory Visit (INDEPENDENT_AMBULATORY_CARE_PROVIDER_SITE_OTHER): Payer: Medicare PPO | Admitting: Family Medicine

## 2023-11-02 VITALS — BP 138/77 | HR 77 | Ht 66.0 in | Wt 150.1 lb

## 2023-11-02 DIAGNOSIS — Z0001 Encounter for general adult medical examination with abnormal findings: Secondary | ICD-10-CM

## 2023-11-02 DIAGNOSIS — I10 Essential (primary) hypertension: Secondary | ICD-10-CM | POA: Diagnosis not present

## 2023-11-02 DIAGNOSIS — E782 Mixed hyperlipidemia: Secondary | ICD-10-CM

## 2023-11-02 DIAGNOSIS — M349 Systemic sclerosis, unspecified: Secondary | ICD-10-CM | POA: Diagnosis not present

## 2023-11-02 DIAGNOSIS — Z0000 Encounter for general adult medical examination without abnormal findings: Secondary | ICD-10-CM

## 2023-11-02 DIAGNOSIS — E038 Other specified hypothyroidism: Secondary | ICD-10-CM

## 2023-11-02 DIAGNOSIS — Z23 Encounter for immunization: Secondary | ICD-10-CM | POA: Diagnosis not present

## 2023-11-02 DIAGNOSIS — K743 Primary biliary cirrhosis: Secondary | ICD-10-CM

## 2023-11-02 DIAGNOSIS — R7303 Prediabetes: Secondary | ICD-10-CM

## 2023-11-02 DIAGNOSIS — M810 Age-related osteoporosis without current pathological fracture: Secondary | ICD-10-CM

## 2023-11-02 NOTE — Progress Notes (Unsigned)
Annual Wellness Visit     Patient: Angela Valenzuela, Female    DOB: 29-Sep-1950, 73 y.o.   MRN: [id number] Visit Date: 11/02/2023  Today's Provider: [name], MD   Chief Complaint  Patient presents with   Annual Exam    Diet - General, trying to cut back on carbs and sugar Exercise - weather permitting waling daily for at least 20 to 30 minutes, if not balance and strength exercise at least 3 times a week for 20 minutes Feeling - well Sleeping - Well Concerns - questions about medication list   Immunizations    Tetanus and Shingles Vaccine - Advised to visit pharamcy   Medication Consultation    -Would like to know if she should be taking 600 calcium or 1200 because medication list only shows calcium 600 daily  -Reports also taking D3 1000 units and was changed after bone density and would like to be sure it is correct because it is also not listed on medication list -Taking statin and see qunol and wants to know if she should be taking that together -How long will she be doing mammograms? -Not a good flyer and will be taking a trip in August and would like to see if she can receive a rx to   Subjective    Angela Valenzuela is a 73 y.o. female who presents today for her Annual Wellness Visit.   Discussed the use of AI scribe software for clinical note transcription with the patient, who gave verbal consent to proceed.  History of Present Illness   The patient, with a history of osteoporosis, hypertension, and hyperlipidemia, presents for a wellness visit. She expresses concerns about her calcium intake, mentioning that she has been taking 1200mg  of calcium and 1000mg  of vitamin D daily. She also discusses her dislike for the large calcium pills and has opted for sugar-free chewable calcium supplements instead.  The patient's blood pressure is well-controlled on hydrochlorothiazide 12.5mg , and she is also on Fosamax for osteoporosis management. She is on simvastatin for  hyperlipidemia and has been stable on this medication for approximately 15 years. She inquires about CoQ10 supplements due to commercials she has seen but reports no side effects from her current cholesterol medication.  The patient also discusses her fear of flying and asks about potential medications to help with this for a trip she has planned in August. She has previously taken a medication for a flight that made her very drowsy and is looking for an alternative.  She has not yet received her flu shot for the year due to a change in her usual appointment schedule.             Medications: Outpatient Medications Prior to Visit  Medication Sig   alendronate (FOSAMAX) 70 MG tablet TAKE 1 TABLET (70 MG TOTAL) BY MOUTH EVERY 7 DAYS WITH FULL GLASS WATER ON EMPTY STOMACH   calcium carbonate (TUMS EX) 750 MG chewable tablet Chew 1 tablet by mouth daily.   Calcium-Vitamin D 600-200 MG-UNIT per tablet Take 2 tablets by mouth daily.   diclofenac Sodium (VOLTAREN) 1 % GEL Apply 1 application topically daily. As needed   hydrochlorothiazide (HYDRODIURIL) 12.5 MG tablet TAKE 1 TABLET BY MOUTH EVERY DAY   PREVIDENT 5000 DRY MOUTH 1.1 % GEL dental gel    simvastatin (ZOCOR) 20 MG tablet TAKE 1 TABLET BY MOUTH EVERYDAY AT BEDTIME   ursodiol (ACTIGALL) 500 MG tablet Take 1 tablet (500 mg total) by  mouth in the morning and at bedtime.   No facility-administered medications prior to visit.    No Known Allergies  Patient Care Team: Anevay Campanella, Marzella Schlein, MD as PCP - General (Family Medicine) Midge Minium, MD as Consulting Physician (Gastroenterology) System, Provider Not In [place], Ronaldo Miyamoto, MD as Referring Physician (Ophthalmology) Defoor, Dionne Ano, PA-C (Rheumatology)  Review of Systems       Objective    Vitals: BP 138/77 (BP Location: Left Arm, Patient Position: Sitting, Cuff Size: Normal)   Pulse 77   Ht 5\' 6"  (1.676 m)   Wt 150 lb 1.6 oz (68.1 kg)   SpO2 100%   BMI 24.23  kg/m      Physical Exam Vitals reviewed.  Constitutional:      General: She is not in acute distress.    Appearance: Normal appearance. She is well-developed. She is not diaphoretic.  HENT:     Head: Normocephalic and atraumatic.     Right Ear: Tympanic membrane, ear canal and external ear normal.     Left Ear: Tympanic membrane, ear canal and external ear normal.     Nose: Nose normal.     Mouth/Throat:     Mouth: Mucous membranes are moist.     Pharynx: Oropharynx is clear. No oropharyngeal exudate.  Eyes:     General: No scleral icterus.    Conjunctiva/sclera: Conjunctivae normal.     Pupils: Pupils are equal, round, and reactive to light.  Neck:     Thyroid: No thyromegaly.  Cardiovascular:     Rate and Rhythm: Normal rate and regular rhythm.     Heart sounds: Normal heart sounds. No murmur heard. Pulmonary:     Effort: Pulmonary effort is normal. No respiratory distress.     Breath sounds: Normal breath sounds. No wheezing or rales.  Abdominal:     General: There is no distension.     Palpations: Abdomen is soft.     Tenderness: There is no abdominal tenderness.  Musculoskeletal:        General: No deformity.     Cervical back: Neck supple.     Right lower leg: No edema.     Left lower leg: No edema.  Lymphadenopathy:     Cervical: No cervical adenopathy.  Skin:    General: Skin is warm and dry.     Findings: No rash.  Neurological:     Mental Status: She is alert and oriented to person, place, and time. Mental status is at baseline.     Gait: Gait normal.  Psychiatric:        Mood and Affect: Mood normal.        Behavior: Behavior normal.        Thought Content: Thought content normal.     Most recent functional status assessment:    10/18/2023   11:40 AM  In your present state of health, do you have any difficulty performing the following activities:  Hearing? 1  Comment placed referral to ENT for hearing evaluation  Vision? 0  Difficulty  concentrating or making decisions? 0  Walking or climbing stairs? 0  Dressing or bathing? 0  Doing errands, shopping? 0  Preparing Food and eating ? N  Using the Toilet? N  In the past six months, have you accidently leaked urine? N  Do you have problems with loss of bowel control? N  Managing your Medications? N  Managing your Finances? N  Housekeeping or managing your Housekeeping? N   Most  recent fall risk assessment:    10/18/2023   11:49 AM  Fall Risk   Falls in the past year? 0  Number falls in past yr: 0  Injury with Fall? 0  Risk for fall due to : No Fall Risks  Follow up Falls prevention discussed    Most recent depression screenings:    10/18/2023   11:55 AM 09/22/2022   11:30 AM  PHQ 2/9 Scores  PHQ - 2 Score 0 0  PHQ- 9 Score  0   Most recent cognitive screening:    10/18/2023   11:57 AM  6CIT Screen  What Year? 0 points  What month? 0 points  What time? 0 points  Count back from 20 0 points  Months in reverse 0 points  Repeat phrase 0 points  Total Score 0 points   Most recent Audit-C alcohol use screening    10/18/2023   11:52 AM  Alcohol Use Disorder Test (AUDIT)  1. How often do you have a drink containing alcohol? 0  2. How many drinks containing alcohol do you have on a typical day when you are drinking? 0  3. How often do you have six or more drinks on one occasion? 0  AUDIT-C Score 0   A score of 3 or more in women, and 4 or more in men indicates increased risk for alcohol abuse, EXCEPT if all of the points are from question 1   No results found.  No results found for any visits on 11/02/23.  Assessment & Plan     Annual wellness visit done today including the all of the following: Reviewed patient's Family Medical History Reviewed and updated list of patient's medical providers Assessment of cognitive impairment was done Assessed patient's functional ability Established a written schedule for health screening services Health Risk  Assessent Completed and Reviewed  Exercise Activities and Dietary recommendations  Goals       Patient Stated (pt-stated)      Exercise more and decrease sugar and carbs in her diet for prediabetes        Immunization History  Administered Date(s) Administered   Fluad Quad(high Dose 65+) 07/24/2019, 07/27/2020, 08/06/2021, 09/02/2022   Fluad Trivalent(High Dose 65+) 11/02/2023   Influenza Split 08/08/2012   Influenza, High Dose Seasonal PF 07/01/2016, 07/14/2017, 07/18/2018   Influenza,inj,Quad PF,6+ Mos 07/27/2013, 08/12/2014, 06/25/2015   PFIZER(Purple Top)SARS-COV-2 Vaccination 10/29/2019, 11/19/2019   Pneumococcal Conjugate-13 11/01/2018   Pneumococcal Polysaccharide-23 07/14/2017   Tdap 01/11/2008    Health Maintenance  Topic Date Due   Zoster Vaccines- Shingrix (1 of 2) Never done   DTaP/Tdap/Td (2 - Td or Tdap) 01/10/2018   COVID-19 Vaccine (3 - Pfizer risk series) 12/17/2019   MAMMOGRAM  07/27/2023   Medicare Annual Wellness (AWV)  11/01/2024   DEXA SCAN  11/07/2024   Fecal DNA (Cologuard)  10/17/2025   Pneumonia Vaccine 53+ Years old  Completed   INFLUENZA VACCINE  Completed   Hepatitis C Screening  Completed   HPV VACCINES  Aged Out   Colonoscopy  Discontinued     Discussed health benefits of physical activity, and encouraged her to engage in regular exercise appropriate for her age and condition.    Problem List Items Addressed This Visit       Cardiovascular and Mediastinum   Hypertension   Hypertension is well-controlled with hydrochlorothiazide 12.5 mg. - Continue hydrochlorothiazide 12.5 mg daily         Digestive   Primary biliary cholangitis (HCC)  Well controlled Continue to monitor LFTs Continue ursodiol 500mg          Endocrine   Subclinical hypothyroidism   TSH stable        Musculoskeletal and Integument   Buschke's scleredema (HCC)   Followed by Rheum      Osteoporosis   Osteoporosis is managed with Fosamax, calcium,  and vitamin D. She is compliant but has switched to calcium gummies due to pill size concerns. Reassured her about the sugar content in gummies and emphasized the importance of continuing her regimen to prevent fractures and maintain bone density. - Continue Fosamax - Continue calcium 1200 mg daily - Continue vitamin D 1000 IU daily        Other   Hyperlipidemia   Hyperlipidemia is managed with simvastatin for approximately 15 years without side effects. Discussed CoQ10 supplementation but deemed unnecessary as she is side effect free. Explained simvastatin's efficacy in reducing cholesterol and preventing cardiovascular events, noting its higher drug interaction profile compared to other statins. - Continue simvastatin      Prediabetes   Prediabetes with an A1c of 5.8%, unchanged from last year. Explained that prediabetes is a warning but not a significant risk at this level. Emphasized maintaining a healthy diet and lifestyle, noting that weight loss alone may not resolve prediabetes due to age-related insulin resistance. - Monitor A1c annually      Other Visit Diagnoses       Encounter for annual wellness visit (AWV) in Medicare patient    -  Primary     Encounter for annual physical exam         Influenza vaccine needed       Relevant Orders   Flu Vaccine Trivalent High Dose (Fluad) (Completed)          Anxiety related to flying She experiences significant anxiety related to flying and inquired about medication. Discussed previous medication causing excessive drowsiness. Explained that medications like Xanax and Valium can help but may cause drowsiness. Discussed non-pharmacological options such as grounding techniques and mindfulness. - Consider low-dose Valium if needed for future flights - Practice grounding techniques and mindfulness for anxiety management  General Health Maintenance She is up to date on most health maintenance items but missed her flu shot this year.  Recommended getting the flu shot due to a late flu season with high rates in [place]. She is also due for Tdap and shingles vaccines. Mammograms should continue annually. Next Cologuard is due in 2027, and next bone density scan is due in 2026. - Administer flu shot today - Obtain Tdap and shingles vaccines at the pharmacy - Continue annual mammograms - Schedule next Cologuard in 2027 - Schedule next bone density scan in 2026  Follow-up - Schedule six-month follow-up - Order labs prior to next visit.          Return in about 6 months (around 05/01/2024) for chronic disease f/u.     [name], MD  [facility] [phone] (phone) [phone] (fax)  [facility]

## 2023-11-03 NOTE — Assessment & Plan Note (Signed)
Hypertension is well-controlled with hydrochlorothiazide 12.5 mg. - Continue hydrochlorothiazide 12.5 mg daily

## 2023-11-03 NOTE — Assessment & Plan Note (Signed)
TSH stable.

## 2023-11-03 NOTE — Assessment & Plan Note (Signed)
Followed by Rheum.

## 2023-11-03 NOTE — Assessment & Plan Note (Signed)
Prediabetes with an A1c of 5.8%, unchanged from last year. Explained that prediabetes is a warning but not a significant risk at this level. Emphasized maintaining a healthy diet and lifestyle, noting that weight loss alone may not resolve prediabetes due to age-related insulin resistance. - Monitor A1c annually

## 2023-11-03 NOTE — Assessment & Plan Note (Signed)
Hyperlipidemia is managed with simvastatin for approximately 15 years without side effects. Discussed CoQ10 supplementation but deemed unnecessary as she is side effect free. Explained simvastatin's efficacy in reducing cholesterol and preventing cardiovascular events, noting its higher drug interaction profile compared to other statins. - Continue simvastatin

## 2023-11-03 NOTE — Assessment & Plan Note (Signed)
Osteoporosis is managed with Fosamax, calcium, and vitamin D. She is compliant but has switched to calcium gummies due to pill size concerns. Reassured her about the sugar content in gummies and emphasized the importance of continuing her regimen to prevent fractures and maintain bone density. - Continue Fosamax - Continue calcium 1200 mg daily - Continue vitamin D 1000 IU daily

## 2023-11-03 NOTE — Assessment & Plan Note (Signed)
Well controlled Continue to monitor LFTs Continue ursodiol 500mg 

## 2023-11-08 ENCOUNTER — Ambulatory Visit: Payer: Medicare PPO

## 2023-11-16 ENCOUNTER — Ambulatory Visit
Admission: RE | Admit: 2023-11-16 | Discharge: 2023-11-16 | Disposition: A | Discharge status: Home / Self Care | Payer: Medicare PPO | Source: Ambulatory Visit | Attending: Family Medicine

## 2023-11-16 DIAGNOSIS — Z1231 Encounter for screening mammogram for malignant neoplasm of breast: Secondary | ICD-10-CM | POA: Diagnosis not present

## 2023-11-21 ENCOUNTER — Encounter: Payer: Self-pay | Admitting: Family Medicine

## 2024-01-12 ENCOUNTER — Encounter: Payer: Self-pay | Admitting: Family Medicine

## 2024-01-28 ENCOUNTER — Other Ambulatory Visit: Payer: Self-pay | Admitting: Family Medicine

## 2024-01-30 NOTE — Telephone Encounter (Signed)
 Requested Prescriptions  Pending Prescriptions Disp Refills   simvastatin  (ZOCOR ) 20 MG tablet [Pharmacy Med Name: SIMVASTATIN  20 MG TABLET] 90 tablet 0    Sig: TAKE 1 TABLET BY MOUTH EVERYDAY AT BEDTIME     Cardiovascular:  Antilipid - Statins Failed - 01/30/2024 12:15 PM      Failed - Lipid Panel in normal range within the last 12 months    Cholesterol, Total  Date Value Ref Range Status  10/27/2023 181 100 - 199 mg/dL Final   LDL Chol Calc (NIH)  Date Value Ref Range Status  10/27/2023 74 0 - 99 mg/dL Final   HDL  Date Value Ref Range Status  10/27/2023 97 >39 mg/dL Final   Triglycerides  Date Value Ref Range Status  10/27/2023 48 0 - 149 mg/dL Final         Passed - Patient is not pregnant      Passed - Valid encounter within last 12 months    Recent Outpatient Visits           2 months ago Encounter for annual wellness visit (AWV) in Medicare patient   [facility] [place], Stan Eans, MD       Future Appointments             In 4 months Bacigalupo, Stan Eans, MD [facility]             hydrochlorothiazide  (HYDRODIURIL ) 12.5 MG tablet [Pharmacy Med Name: HYDROCHLOROTHIAZIDE  12.5 MG TB] 90 tablet 1    Sig: TAKE 1 TABLET BY MOUTH EVERY DAY     Cardiovascular: Diuretics - Thiazide Passed - 01/30/2024 12:15 PM      Passed - Cr in normal range and within 180 days    Creatinine, Ser  Date Value Ref Range Status  10/27/2023 0.76 0.57 - 1.00 mg/dL Final         Passed - K in normal range and within 180 days    Potassium  Date Value Ref Range Status  10/27/2023 4.2 3.5 - 5.2 mmol/L Final         Passed - Na in normal range and within 180 days    Sodium  Date Value Ref Range Status  10/27/2023 143 134 - 144 mmol/L Final         Passed - Last BP in normal range    BP Readings from Last 1 Encounters:  11/02/23 138/77         Passed - Valid encounter within last 6 months    Recent Outpatient  Visits           2 months ago Encounter for annual wellness visit (AWV) in Medicare patient   [place] [facility] [place], Stan Eans, MD       Future Appointments             In 4 months Bacigalupo, Stan Eans, MD [facility]

## 2024-02-20 ENCOUNTER — Ambulatory Visit: Payer: Self-pay

## 2024-02-20 NOTE — Telephone Encounter (Signed)
 Copied from CRM [id number]. Topic: Clinical - Red Word Triage >> Feb 20, 2024 11:02 AM Elle L wrote: Red Word that prompted transfer to Nurse Triage: The patient states she has pain in her upper back like a pulled muscle but she is not sure how it happened and she is concerned about her lungs.  Chief Complaint: upper back pain Symptoms: pain Frequency: comes and goes Pertinent Negatives: Patient denies fever, injury, cp, sob Disposition: [] ED /[] Urgent Care (no appt availability in office) / [x] Appointment(In office/virtual)/ []  [facility]/ [] Home Care/ [] Refused Recommended Disposition /[] [facility]/ []  Follow-up with PCP Additional Notes: apt made; care advice given, denies questions; instructed to go to ER if becomes worse.   Reason for Disposition  [1] MODERATE back pain (e.g., interferes with normal activities) AND [2] present > 3 days  Answer Assessment - Initial Assessment Questions 1. ONSET: "When did the pain begin?"      Saturday evening 2. LOCATION: "Where does it hurt?" (upper, mid or lower back)     Upper back pain 3. SEVERITY: "How bad is the pain?"  (e.g., Scale 1-10; mild, moderate, or severe)   - MILD (1-3): Doesn't interfere with normal activities.    - MODERATE (4-7): Interferes with normal activities or awakens from sleep.    - SEVERE (8-10): Excruciating pain, unable to do any normal activities.      mild 4. PATTERN: "Is the pain constant?" (e.g., yes, no; constant, intermittent)      Comes and goes 5. RADIATION: "Does the pain shoot into your legs or somewhere else?"     no 6. CAUSE:  "What do you think is causing the back pain?"      unknown 7. BACK OVERUSE:  "Any recent lifting of heavy objects, strenuous work or exercise?"     no 8. MEDICINES: "What have you taken so far for the pain?" (e.g., nothing, acetaminophen, NSAIDS)     no 9. NEUROLOGIC SYMPTOMS: "Do you have any weakness, numbness, or problems with bowel/bladder control?"      no 10. OTHER SYMPTOMS: "Do you have any other symptoms?" (e.g., fever, abdomen pain, burning with urination, blood in urine)       no 11. PREGNANCY: "Is there any chance you are pregnant?" "When was your last menstrual period?"       na  Protocols used: Back Pain-A-AH

## 2024-02-29 ENCOUNTER — Ambulatory Visit: Admitting: Physician Assistant

## 2024-05-07 ENCOUNTER — Other Ambulatory Visit: Payer: Self-pay | Admitting: Family Medicine

## 2024-06-04 ENCOUNTER — Encounter: Payer: Self-pay | Admitting: Family Medicine

## 2024-06-04 ENCOUNTER — Ambulatory Visit: Payer: Medicare PPO | Admitting: Family Medicine

## 2024-06-04 VITALS — BP 128/68 | HR 81 | Ht 66.0 in | Wt 150.7 lb

## 2024-06-04 DIAGNOSIS — R7303 Prediabetes: Secondary | ICD-10-CM | POA: Diagnosis not present

## 2024-06-04 DIAGNOSIS — E782 Mixed hyperlipidemia: Secondary | ICD-10-CM | POA: Diagnosis not present

## 2024-06-04 DIAGNOSIS — I8393 Asymptomatic varicose veins of bilateral lower extremities: Secondary | ICD-10-CM | POA: Diagnosis not present

## 2024-06-04 DIAGNOSIS — E038 Other specified hypothyroidism: Secondary | ICD-10-CM | POA: Diagnosis not present

## 2024-06-04 DIAGNOSIS — M810 Age-related osteoporosis without current pathological fracture: Secondary | ICD-10-CM | POA: Diagnosis not present

## 2024-06-04 DIAGNOSIS — H9190 Unspecified hearing loss, unspecified ear: Secondary | ICD-10-CM | POA: Diagnosis not present

## 2024-06-04 DIAGNOSIS — I10 Essential (primary) hypertension: Secondary | ICD-10-CM | POA: Diagnosis not present

## 2024-06-04 NOTE — Assessment & Plan Note (Signed)
 Previously diagnosed with prediabetes with an A1c of 5.8%. Discussed that A1c reflects a three-month average and is not affected by fasting. She is concerned about recent dietary habits due to travel but reassured that current levels are not near diabetes. - Order A1c test to monitor prediabetes.

## 2024-06-04 NOTE — Assessment & Plan Note (Signed)
 Blood pressure is well-controlled with current medication regimen, with readings mostly in the 120s/60s and occasional readings in the 130s. Discussed recent studies suggesting blood pressure medications may be more effective at night, but hydrochlorothiazide  is taken in the morning to avoid nocturia. - Continue hydrochlorothiazide  12.5 mg daily in the morning.

## 2024-06-04 NOTE — Assessment & Plan Note (Signed)
 Thyroid  labs have been slightly off, but condition remains subclinical and not currently affecting health. - Order thyroid  function tests to monitor subclinical hypothyroidism.

## 2024-06-04 NOTE — Assessment & Plan Note (Signed)
 Cholesterol is managed with simvastatin  20 mg daily, taken at bedtime, which is appropriate and supported by current guidelines. - Continue simvastatin  20 mg daily at bedtime.

## 2024-06-04 NOTE — Progress Notes (Signed)
 Established patient visit   Patient: Angela Valenzuela   DOB: 06/12/1951   73 y.o. Female  MRN: [id number] Visit Date: 06/04/2024  Today's healthcare provider: Jon Eva, MD   Chief Complaint  Patient presents with   Medical Management of Chronic Issues   Hypertension    She reports that she monitors at home with readings ranging from 108-139/63-79. No smoking. No symptoms   Hyperlipidemia   Subjective    Hypertension  Hyperlipidemia   HPI     Hypertension    Additional comments: She reports that she monitors at home with readings ranging from 108-139/63-79. No smoking. No symptoms      Last edited by Lilian Fitzpatrick, CMA on 06/04/2024 10:56 AM.       Discussed the use of AI scribe software for clinical note transcription with the patient, who gave verbal consent to proceed.  History of Present Illness   Angela Valenzuela is a 73 year old female who presents for a routine follow-up visit.  Her home blood pressure readings are mostly in the 120s/60s, with occasional readings in the 130s. She continues on hydrochlorothiazide  12.5 mg daily and simvastatin  20 mg at bedtime. She inquires about her annual ursodiol  medication, noting her doctor is unavailable for office visits, but a nurse can renew her prescription if needed.  She experiences a sensation in her feet and legs described as 'a net', which comes and goes. She has varicose veins and wears compression socks, especially during flights, which she finds beneficial. She has arthritis in her feet but reports no pain. Her neuropathy symptoms are atypical, lacking pain or numbness.  She has a history of digital cyanosis, with fingers turning white, attributed to Raynaud's phenomenon. Subclinical thyroidism is monitored with regular thyroid  function tests.  She is concerned about her hearing and considers testing, as she sometimes feels her hearing has declined. A previous test indicated no need for hearing  aids.  She plans to schedule a flu shot and shingles vaccine for October and is due for a tetanus shot, which she can receive at a pharmacy.  She is aware of her prediabetes status, with a last A1c of 5.8, and is concerned about her diet due to travel plans potentially affecting her blood sugar levels.         Medications: Outpatient Medications Prior to Visit  Medication Sig   alendronate  (FOSAMAX ) 70 MG tablet TAKE 1 TABLET (70 MG TOTAL) BY MOUTH EVERY 7 DAYS WITH FULL GLASS WATER ON EMPTY STOMACH   calcium carbonate (TUMS EX) 750 MG chewable tablet Chew 1 tablet by mouth daily.   Calcium Carbonate-Vit D-Min (CALCIUM 1200 PO) Take by mouth.   Cholecalciferol (VITAMIN D3) 25 MCG (1000 UT) CAPS Take by mouth.   diclofenac Sodium (VOLTAREN) 1 % GEL Apply 1 application topically daily. As needed   hydrochlorothiazide  (HYDRODIURIL ) 12.5 MG tablet TAKE 1 TABLET BY MOUTH EVERY DAY   PREVIDENT 5000 DRY MOUTH 1.1 % GEL dental gel    simvastatin  (ZOCOR ) 20 MG tablet TAKE 1 TABLET BY MOUTH EVERYDAY AT BEDTIME   ursodiol  (ACTIGALL ) 500 MG tablet Take 1 tablet (500 mg total) by mouth in the morning and at bedtime.   [DISCONTINUED] Calcium-Vitamin D 600-200 MG-UNIT per tablet Take 2 tablets by mouth daily. (Patient not taking: Reported on 06/04/2024)   No facility-administered medications prior to visit.    Review of Systems     Objective    BP 128/68 Comment: home reading  Pulse 81   Ht 5' 6 (1.676 m)   Wt 150 lb 11.2 oz (68.4 kg)   SpO2 100%   BMI 24.32 kg/m    Physical Exam Vitals reviewed.  Constitutional:      General: She is not in acute distress.    Appearance: Normal appearance. She is well-developed. She is not diaphoretic.  HENT:     Head: Normocephalic and atraumatic.  Eyes:     General: No scleral icterus.    Conjunctiva/sclera: Conjunctivae normal.  Neck:     Thyroid : No thyromegaly.  Cardiovascular:     Rate and Rhythm: Normal rate and regular rhythm.      Heart sounds: Normal heart sounds. No murmur heard. Pulmonary:     Effort: Pulmonary effort is normal. No respiratory distress.     Breath sounds: Normal breath sounds. No wheezing, rhonchi or rales.  Musculoskeletal:     Cervical back: Neck supple.     Right lower leg: No edema.     Left lower leg: No edema.     Comments: +varicoseveins  Lymphadenopathy:     Cervical: No cervical adenopathy.  Skin:    General: Skin is warm and dry.     Findings: No rash.  Neurological:     Mental Status: She is alert and oriented to person, place, and time. Mental status is at baseline.  Psychiatric:        Mood and Affect: Mood normal.        Behavior: Behavior normal.      No results found for any visits on 06/04/24.  Assessment & Plan     Problem List Items Addressed This Visit       Cardiovascular and Mediastinum   Hypertension - Primary   Blood pressure is well-controlled with current medication regimen, with readings mostly in the 120s/60s and occasional readings in the 130s. Discussed recent studies suggesting blood pressure medications may be more effective at night, but hydrochlorothiazide  is taken in the morning to avoid nocturia. - Continue hydrochlorothiazide  12.5 mg daily in the morning.      Relevant Orders   Comprehensive metabolic panel with GFR     Endocrine   Subclinical hypothyroidism   Thyroid  labs have been slightly off, but condition remains subclinical and not currently affecting health. - Order thyroid  function tests to monitor subclinical hypothyroidism.       Relevant Orders   TSH + free T4     Musculoskeletal and Integument   Osteoporosis   Continue fosamax  Repeat DEXA in 2026      Relevant Medications   Calcium Carbonate-Vit D-Min (CALCIUM 1200 PO)   Cholecalciferol (VITAMIN D3) 25 MCG (1000 UT) CAPS     Other   Hyperlipidemia   Cholesterol is managed with simvastatin  20 mg daily, taken at bedtime, which is appropriate and supported by current  guidelines. - Continue simvastatin  20 mg daily at bedtime.      Relevant Orders   Comprehensive metabolic panel with GFR   Lipid panel   Prediabetes   Previously diagnosed with prediabetes with an A1c of 5.8%. Discussed that A1c reflects a three-month average and is not affected by fasting. She is concerned about recent dietary habits due to travel but reassured that current levels are not near diabetes. - Order A1c test to monitor prediabetes.      Relevant Orders   Hemoglobin A1c   Other Visit Diagnoses       Subjective hearing loss  Relevant Orders   Ambulatory referral to Audiology     Asymptomatic varicose veins of both lower extremities               Peripheral neuropathy of lower extremities Reports abnormal sensation in feet and legs, described as feeling like a net. No pain or numbness reported. Neuropathy is atypical as it does not cause pain or numbness. Discussed that neuropathy can present as abnormal sensations due to nerve signal miscommunication.  Varicose veins of lower extremities Presence of varicose veins noted. She reports no pain associated with varicose veins. Discussed that varicose veins are mostly cosmetic unless they cause pain. Compression socks may help with abnormal sensations. - Recommend wearing compression socks, especially on days with abnormal sensations in legs.  Raynaud's phenomenon Continues to experience Raynaud's phenomenon with fingers turning white.         Return in about 6 months (around 12/02/2024) for CPE.       Jon Eva, MD  [facility] [phone] (phone) [phone] (fax)  [facility]

## 2024-06-04 NOTE — Assessment & Plan Note (Signed)
 Continue fosamax  Repeat DEXA in 2026

## 2024-06-05 ENCOUNTER — Ambulatory Visit: Payer: Self-pay | Admitting: Family Medicine

## 2024-06-05 LAB — COMPREHENSIVE METABOLIC PANEL WITH GFR
ALT: 13 IU/L (ref 0–32)
AST: 17 IU/L (ref 0–40)
Albumin: 4.7 g/dL (ref 3.8–4.8)
Alkaline Phosphatase: 76 IU/L (ref 49–135)
BUN/Creatinine Ratio: 14 (ref 12–28)
BUN: 10 mg/dL (ref 8–27)
Bilirubin Total: 0.4 mg/dL (ref 0.0–1.2)
CO2: 24 mmol/L (ref 20–29)
Calcium: 9.4 mg/dL (ref 8.7–10.3)
Chloride: 100 mmol/L (ref 96–106)
Creatinine, Ser: 0.74 mg/dL (ref 0.57–1.00)
Globulin, Total: 1.7 g/dL (ref 1.5–4.5)
Glucose: 91 mg/dL (ref 70–99)
Potassium: 4.7 mmol/L (ref 3.5–5.2)
Sodium: 139 mmol/L (ref 134–144)
Total Protein: 6.4 g/dL (ref 6.0–8.5)
eGFR: 85 mL/min/1.73 (ref >59)

## 2024-06-05 LAB — LIPID PANEL
Chol/HDL Ratio: 1.8 ratio (ref 0.0–4.4)
Cholesterol, Total: 199 mg/dL (ref 100–199)
HDL: 110 mg/dL (ref >39)
LDL Chol Calc (NIH): 77 mg/dL (ref 0–99)
Triglycerides: 65 mg/dL (ref 0–149)
VLDL Cholesterol Cal: 12 mg/dL (ref 5–40)

## 2024-06-05 LAB — TSH+FREE T4
Free T4: 1.42 ng/dL (ref 0.82–1.77)
TSH: 2.18 u[IU]/mL (ref 0.450–4.500)

## 2024-06-05 LAB — HEMOGLOBIN A1C
Est. average glucose Bld gHb Est-mCnc: 108 mg/dL
Hgb A1c MFr Bld: 5.4 % (ref 4.8–5.6)

## 2024-06-06 ENCOUNTER — Telehealth: Payer: Self-pay

## 2024-06-06 NOTE — Telephone Encounter (Signed)
 Copied from CRM [id number]. Topic: Clinical - Medical Advice >> Jun 06, 2024 10:26 AM Marylynn H wrote: Reason for CRM: Patient states she had blood drawn on 09/16 and is having bruising now around the area, she also noticed her veins around the bruising look to be sticking up but they do go back down.. not normal for her so a bit concerned. Wants to know if she can come in and have nurse take a look at it for her? please reach out to patient # [phone]

## 2024-06-07 NOTE — Telephone Encounter (Signed)
 She can definitely have a visit for someone to take a look and make sure everything is ok

## 2024-07-04 ENCOUNTER — Other Ambulatory Visit: Payer: Self-pay | Admitting: Gastroenterology

## 2024-07-16 ENCOUNTER — Ambulatory Visit (INDEPENDENT_AMBULATORY_CARE_PROVIDER_SITE_OTHER)

## 2024-07-16 ENCOUNTER — Ambulatory Visit

## 2024-07-16 DIAGNOSIS — Z23 Encounter for immunization: Secondary | ICD-10-CM

## 2024-07-24 ENCOUNTER — Other Ambulatory Visit: Payer: Self-pay | Admitting: Family Medicine

## 2024-07-25 NOTE — Telephone Encounter (Signed)
 Requested Prescriptions  Pending Prescriptions Disp Refills   hydrochlorothiazide  (HYDRODIURIL ) 12.5 MG tablet [Pharmacy Med Name: HYDROCHLOROTHIAZIDE  12.5 MG TB] 90 tablet 0    Sig: TAKE 1 TABLET BY MOUTH EVERY DAY     Cardiovascular: Diuretics - Thiazide Passed - 07/25/2024  2:09 PM      Passed - Cr in normal range and within 180 days    Creatinine, Ser  Date Value Ref Range Status  06/04/2024 0.74 0.57 - 1.00 mg/dL Final         Passed - K in normal range and within 180 days    Potassium  Date Value Ref Range Status  06/04/2024 4.7 3.5 - 5.2 mmol/L Final         Passed - Na in normal range and within 180 days    Sodium  Date Value Ref Range Status  06/04/2024 139 134 - 144 mmol/L Final         Passed - Last BP in normal range    BP Readings from Last 1 Encounters:  06/04/24 128/68         Passed - Valid encounter within last 6 months    Recent Outpatient Visits           1 month ago Primary hypertension   [facility], Jon HERO, MD   8 months ago Encounter for annual wellness visit (AWV) in Medicare patient   [facility] [place], Jon HERO, MD

## 2024-08-05 DIAGNOSIS — H00014 Hordeolum externum left upper eyelid: Secondary | ICD-10-CM | POA: Diagnosis not present

## 2024-08-11 ENCOUNTER — Other Ambulatory Visit: Payer: Self-pay | Admitting: Family Medicine

## 2024-09-27 ENCOUNTER — Other Ambulatory Visit: Payer: Self-pay | Admitting: Family Medicine

## 2024-09-30 ENCOUNTER — Other Ambulatory Visit: Payer: Self-pay | Admitting: Gastroenterology

## 2024-10-08 ENCOUNTER — Telehealth: Payer: Self-pay

## 2024-10-08 ENCOUNTER — Other Ambulatory Visit: Payer: Self-pay | Admitting: Family Medicine

## 2024-10-08 NOTE — Telephone Encounter (Signed)
 Per pt need refill on Ursodiol  500 mg . I have made appt for 11-13-2024. Pt was a pt of Dr. jinny

## 2024-10-08 NOTE — Progress Notes (Signed)
 error

## 2024-10-19 ENCOUNTER — Other Ambulatory Visit: Payer: Self-pay | Admitting: Family Medicine

## 2024-10-19 DIAGNOSIS — I10 Essential (primary) hypertension: Secondary | ICD-10-CM

## 2024-11-12 ENCOUNTER — Ambulatory Visit: Payer: Medicare PPO

## 2024-11-13 ENCOUNTER — Ambulatory Visit: Admitting: Family Medicine

## 2024-11-21 ENCOUNTER — Encounter: Admitting: Family Medicine

## 2024-12-05 ENCOUNTER — Encounter: Admitting: Family Medicine
